# Patient Record
Sex: Male | Born: 1970 | Race: White | Hispanic: No | Marital: Single | Smoking: Current every day smoker
Health system: Southern US, Community
[De-identification: ages and names within clinical notes are randomized; demographics above are authoritative.]

## PROBLEM LIST (undated history)

## (undated) DIAGNOSIS — F32A Depression, unspecified: Secondary | ICD-10-CM

## (undated) DIAGNOSIS — F329 Major depressive disorder, single episode, unspecified: Secondary | ICD-10-CM

## (undated) DIAGNOSIS — F419 Anxiety disorder, unspecified: Secondary | ICD-10-CM

---

## 2006-01-25 ENCOUNTER — Ambulatory Visit (HOSPITAL_COMMUNITY): Admission: RE | Admit: 2006-01-25 | Discharge: 2006-01-25 | Payer: Self-pay | Admitting: Family Medicine

## 2006-01-28 ENCOUNTER — Ambulatory Visit (HOSPITAL_COMMUNITY): Admission: RE | Admit: 2006-01-28 | Discharge: 2006-01-28 | Payer: Self-pay | Admitting: Family Medicine

## 2006-02-11 ENCOUNTER — Ambulatory Visit: Payer: Self-pay | Admitting: Gastroenterology

## 2006-04-26 ENCOUNTER — Ambulatory Visit: Payer: Self-pay | Admitting: Gastroenterology

## 2006-05-03 ENCOUNTER — Ambulatory Visit: Payer: Self-pay | Admitting: Gastroenterology

## 2006-05-03 ENCOUNTER — Ambulatory Visit (HOSPITAL_COMMUNITY): Admission: RE | Admit: 2006-05-03 | Discharge: 2006-05-03 | Payer: Self-pay | Admitting: Gastroenterology

## 2006-05-03 ENCOUNTER — Encounter (INDEPENDENT_AMBULATORY_CARE_PROVIDER_SITE_OTHER): Payer: Self-pay | Admitting: *Deleted

## 2006-05-03 HISTORY — PX: ESOPHAGOGASTRODUODENOSCOPY: SHX1529

## 2008-08-07 HISTORY — PX: SKIN BIOPSY: SHX1

## 2008-11-12 ENCOUNTER — Encounter (INDEPENDENT_AMBULATORY_CARE_PROVIDER_SITE_OTHER): Payer: Self-pay | Admitting: General Surgery

## 2008-11-12 ENCOUNTER — Ambulatory Visit (HOSPITAL_COMMUNITY): Admission: RE | Admit: 2008-11-12 | Discharge: 2008-11-12 | Payer: Self-pay | Admitting: General Surgery

## 2010-03-16 ENCOUNTER — Ambulatory Visit: Payer: Self-pay | Admitting: Otolaryngology

## 2010-05-18 ENCOUNTER — Ambulatory Visit (INDEPENDENT_AMBULATORY_CARE_PROVIDER_SITE_OTHER): Payer: BC Managed Care – PPO | Admitting: Otolaryngology

## 2010-05-18 DIAGNOSIS — D487 Neoplasm of uncertain behavior of other specified sites: Secondary | ICD-10-CM

## 2010-05-18 DIAGNOSIS — R220 Localized swelling, mass and lump, head: Secondary | ICD-10-CM

## 2010-05-18 DIAGNOSIS — R221 Localized swelling, mass and lump, neck: Secondary | ICD-10-CM

## 2010-07-15 LAB — BASIC METABOLIC PANEL
CO2: 24 mEq/L (ref 19–32)
Calcium: 9.3 mg/dL (ref 8.4–10.5)
Creatinine, Ser: 0.87 mg/dL (ref 0.4–1.5)
GFR calc Af Amer: >60 mL/min (ref >60)
GFR calc non Af Amer: >60 mL/min (ref >60)
Glucose, Bld: 96 mg/dL (ref 70–99)
Sodium: 138 mEq/L (ref 135–145)

## 2010-07-15 LAB — CBC
Hemoglobin: 16.1 g/dL (ref 13.0–17.0)
MCHC: 35.2 g/dL (ref 30.0–36.0)
RDW: 12.6 % (ref 11.5–15.5)

## 2010-08-22 NOTE — H&P (Signed)
NAMEKENLY, XIAO                ACCOUNT NO.:  [id number]   MEDICAL RECORD NO.:  [id number]          PATIENT TYPE:  AMB   LOCATION:  DAY                           FACILITY:  [facility]   PHYSICIAN:  Tilford Pillar, MD      DATE OF BIRTH:  Sep 18, 1970   DATE OF ADMISSION:  DATE OF DISCHARGE:  LH                              HISTORY & PHYSICAL   CHIEF COMPLAINT:  Bump on face.   HISTORY OF PRESENT ILLNESS:  The patient is a 40 year old male, who  presented to my office on referral for approximately 6-7 month history  of a small nodule on his left buccal aspect of his face.  This is not  that significantly changed in size, although he does state it is  slightly larger than when he first noticed it.  He has not checked has  had any associated pain.  He has had no overlying erythema.  No  discharge.  He has had no fever or chills.  He has had no significant  weight changes.  The patient denies any trauma.  No difficulties with  dysphasia.   PAST MEDICAL HISTORY:  None.   PAST SURGICAL HISTORY:  None.   MEDICATIONS:  None.   ALLERGIES:  No known drug allergies.   SOCIAL HISTORY:  He is a 1-pack per day smoker.  He does occasionally  drink on weekends.  Occupation is as a [employer] architect.   FAMILY HISTORY:  There is a family history of cancer, although the  patient is unsure of exact type of cancer.   REVIEW OF SYSTEMS:  CONSTITUTIONAL:  Unremarkable.  EYES:  Unremarkable.  EARS, NOSE AND THROAT:  As per HPI, otherwise unremarkable.  RESPIRATORY:  Unremarkable.  CARDIOVASCULAR:  Unremarkable.  GASTROINTESTINAL:  Unremarkable.  GENITOURINARY:  Unremarkable.  MUSCULOSKELETAL, SKIN, ENDOCRINE, AND NEURO:  Are all unremarkable.   PHYSICAL EXAMINATION:  GENERAL:  The patient is a healthy, calm-  appearing male, in no acute distress.  He is alert and oriented x3.  HEENT:  Scalp, no deformities, no masses.  Eyes, pupils are equal,  extraocular movements are intact.  No conjunctival pallor is  noted.  Oral mucosa is pink.  Normal occlusion.  No tooth abnormality is noted.  The patient does have a mobile, nontender, very superficial nodule on  the left buccal region and does not appear to be attached in any  underlying structures.  There is no sensory changes with tapping or  palpations.  On intraoral bimanual palpation manipulation, they did not  appreciate any oral mucosal involvement, does not feel to be attached  muscle.  There is no sinus noted either intraoral or changes.  NECK:  Trachea is midline.  No cervical lymphadenopathy.  PULMONARY:  Unlabored respirations.  He is clear to auscultation.  No  crackles are appreciated.  CARDIOVASCULAR:  Regular rate and rhythm.  He has 2+ radial and femoral  pulses bilaterally.  ABDOMEN:  Positive bowel sounds.  Abdomen is soft and nontender.  EXTREMITIES:  Were warm and dry.   PLAN:  Facial cyst.  At this time, I  did discuss with the patient the  findings.  I have a low suspicion of any involvement of the parotid  gland or any salivary gland etiology based on its appearance.  I have a  low suspicion at the present, it does appear to be a benign lesion, it  is extremely superficial.  I did discuss with the patient the  possibility of excision.  I did discuss possibility of salivary gland  involvement or node involvement, however, I have an extremely low  suspicion that they are at risk.  He understands the risks, benefits,  and alternatives of excision, and does wish to proceed at his earliest  convenience.      Tilford Pillar, MD  Electronically Signed     BZ/MEDQ  D:  11/11/2008  T:  11/12/2008  Job:  [id number]   cc:   Angus G. Renard Matter, MD  Fax: [phone]

## 2010-08-22 NOTE — Op Note (Signed)
Randy Wu, Randy Wu                ACCOUNT NO.:  [id number]   MEDICAL RECORD NO.:  [id number]          PATIENT TYPE:  AMB   LOCATION:  DAY                           FACILITY:  [facility]   PHYSICIAN:  Tilford Pillar, MD      DATE OF BIRTH:  11/22/1970   DATE OF PROCEDURE:  11/12/2008  DATE OF DISCHARGE:  11/12/2008                               OPERATIVE REPORT   PREOPERATIVE DIAGNOSIS:  Soft tissue mass on the left buccal space.   POSTOPERATIVE DIAGNOSIS:  Soft tissue mass on the left buccal space.   PROCEDURE:  Excision of soft tissue mass via 1.5-cm incision on the left  buccal space.   SURGEON:  Tilford Pillar, MD   ANESTHESIA:  MAC sedation with local anesthetic, utilized 1% lidocaine  with epinephrine.   SPECIMEN:  Soft tissue mass.   INTRAOPERATIVE FINDINGS:  Suspicious for a sebaceous cyst.   ESTIMATED BLOOD LOSS:  Scant.   INDICATIONS:  The patient is a 40 year old male who presented to my  office with a history of a palpable nodule along his left cheek.  This  has been present for several months, slowly increased in size.  Due to  its slight increase in size, the patient did wish to proceed with  excision.  The risks, benefits, and alternatives of excision of soft  tissue mass was discussed at length with the patient including but not  limited to risk of bleeding, infection, and recurrence.  At the time of  preoperative evaluation, it did not appear to be involving the parotid  or the underlying musculature or neurovascular structures.  The  patient's questions and concerns were addressed.  The patient was  consented for planned procedure.   OPERATION:  The patient was taken to the operating room and placed in  the supine position on the operating room table at which time the MAC  anesthetic was administered.  Once the patient was sedated, his left  face was prepped with Betadine solution.  Sterile drapes were placed  using Ioban drape to protect the area.  At this time,  a marking pen was  utilized to mark an elliptical incision orienting this to fall within  the skin folds.  Local anesthetic was instilled.  Skin incision was made  initially with a scalpel.  Additional dissection was carried out using  the needle-tipped electrocautery.  The soft tissue mass was  circumferentially excised and upon removal of this and placed in the  back table.  During the dissection, a small defect was created, which  did express some sebaceous-appearing material.  The wound was copiously  irrigated.  The hemostasis was obtained using electrocautery.  I was  quite pleased with the appearance of the wound.  At this time, I  reapproximated the skin edges with a 4-0 Monocryl in a running  subcuticular suture.  The skin was washed and dried with moist and dry  towel.  Benzoin was applied around the incision.  Half-inch Steri-Strips  were placed.  The drapes were removed.  The patient was allowed to come  out of  sedation  and was transferred back to regular hospital bed.  He was  transferred to Postanesthetic Care Unit in stable condition.  At the  conclusion of procedure, all instrument, sponge, and needle counts were  correct.  The patient tolerated the procedure extremely well.      Tilford Pillar, MD  Electronically Signed     BZ/MEDQ  D:  11/12/2008  T:  11/13/2008  Job:  [id number]   cc:   Angus G. Renard Matter, MD  Fax: [phone]

## 2010-08-25 NOTE — Op Note (Signed)
NAMEKENNEN, STAMMER                ACCOUNT NO.:  [id number]   MEDICAL RECORD NO.:  [id number]          PATIENT TYPE:  AMB   LOCATION:  DAY                           FACILITY:  [facility]   PHYSICIAN:  Kassie Mends, M.D.      DATE OF BIRTH:  [birth year]   DATE OF PROCEDURE:  05/03/2006  DATE OF DISCHARGE:                               OPERATIVE REPORT   REFERRING PHYSICIAN:  Catalina Pizza, M.D.   PROCEDURE:  Esophagogastroduodenoscopy with cold forceps biopsy.   INDICATION FOR EXAMINATION:  Mr. Yost is a 40 year old male with  persistent left upper quadrant pain.  He was first seen in November 2007  with left upper quadrant pain which is felt to be musculoskeletal in  etiology.  He presented again January 2008 with persistent left upper  quadrant pain which was exacerbated by food.  He also had a 7-pound  weight loss.  His pain was improved with AcipHex.   FINDINGS:  Mild erythema of the body and antrum.  Biopsies obtained to  evaluate for eosinophilic gastritis or H. pylori infection.  Otherwise  normal esophagus without erythema, erosion or ulceration.  No evidence  of Barrett's.  Normal duodenal bulb and second portion of the duodenum.   RECOMMENDATIONS:  1. Continue AcipHex.  2. I will call Mr. Goldring was results of his biopsies.  The      differential diagnosis includes musculoskeletal abdominal pain, H.      pylori gastritis, eosinophilic gastritis, non-ulcer dyspepsia, and      gastroesophageal reflux disease.  3. He has a follow-up appointment with me in 4 weeks.   MEDICATIONS:  1. Demerol 125 mg IV.  2. Versed 7 mg IV.   PROCEDURE TECHNIQUE:  Physical exam was performed and informed consent  was obtained from the patient after explaining the benefits, risks and  alternatives to the procedure.  The patient was connected to the monitor  and placed in the left lateral position.  Continuous oxygen was provided  by nasal cannula and IV medicine administered through an indwelling  cannula.  After administration of sedation, the patient's esophagus was  intubated with a diagnostic gastroscope and the scope was advanced under  direct visualization to the second portion of the  duodenum.  The scope was subsequently removed slowly by carefully  examining the color, texture, anatomy and integrity of the mucosa on the  way out.  The patient was recovered endoscopy suite and discharged home  in satisfactory condition.      Kassie Mends, M.D.  Electronically Signed     SM/MEDQ  D:  05/03/2006  T:  05/03/2006  Job:  [id number]   cc:   Catalina Pizza, M.D.  Fax: [phone]

## 2011-10-05 ENCOUNTER — Emergency Department (HOSPITAL_COMMUNITY): Payer: BC Managed Care – PPO

## 2011-10-05 ENCOUNTER — Encounter (HOSPITAL_COMMUNITY): Payer: Self-pay | Admitting: *Deleted

## 2011-10-05 ENCOUNTER — Emergency Department (HOSPITAL_COMMUNITY)
Admission: EM | Admit: 2011-10-05 | Discharge: 2011-10-06 | Disposition: A | Discharge status: Home / Self Care | Payer: BC Managed Care – PPO | Attending: Emergency Medicine | Admitting: Emergency Medicine

## 2011-10-05 DIAGNOSIS — I2510 Atherosclerotic heart disease of native coronary artery without angina pectoris: Secondary | ICD-10-CM | POA: Insufficient documentation

## 2011-10-05 DIAGNOSIS — I700 Atherosclerosis of aorta: Secondary | ICD-10-CM | POA: Insufficient documentation

## 2011-10-05 DIAGNOSIS — M549 Dorsalgia, unspecified: Secondary | ICD-10-CM | POA: Insufficient documentation

## 2011-10-05 DIAGNOSIS — R197 Diarrhea, unspecified: Secondary | ICD-10-CM | POA: Insufficient documentation

## 2011-10-05 DIAGNOSIS — R110 Nausea: Secondary | ICD-10-CM | POA: Insufficient documentation

## 2011-10-05 DIAGNOSIS — R079 Chest pain, unspecified: Secondary | ICD-10-CM | POA: Insufficient documentation

## 2011-10-05 DIAGNOSIS — R6883 Chills (without fever): Secondary | ICD-10-CM | POA: Insufficient documentation

## 2011-10-05 LAB — COMPREHENSIVE METABOLIC PANEL
ALT: 30 U/L (ref 0–53)
Alkaline Phosphatase: 57 U/L (ref 39–117)
BUN: 15 mg/dL (ref 6–23)
CO2: 24 mEq/L (ref 19–32)
Chloride: 99 mEq/L (ref 96–112)
GFR calc Af Amer: 85 mL/min — ABNORMAL LOW (ref >90)
Glucose, Bld: 85 mg/dL (ref 70–99)
Potassium: 3.4 mEq/L — ABNORMAL LOW (ref 3.5–5.1)
Sodium: 135 mEq/L (ref 135–145)
Total Bilirubin: 0.4 mg/dL (ref 0.3–1.2)
Total Protein: 7.4 g/dL (ref 6.0–8.3)

## 2011-10-05 LAB — CBC WITH DIFFERENTIAL/PLATELET
Hemoglobin: 16.3 g/dL (ref 13.0–17.0)
Lymphocytes Relative: 37 % (ref 12–46)
Lymphs Abs: 1.9 10*3/uL (ref 0.7–4.0)
Monocytes Relative: 11 % (ref 3–12)
Neutro Abs: 2.4 10*3/uL (ref 1.7–7.7)
Neutrophils Relative %: 47 % (ref 43–77)
Platelets: 172 10*3/uL (ref 150–400)
RBC: 4.85 MIL/uL (ref 4.22–5.81)
WBC: 5 10*3/uL (ref 4.0–10.5)

## 2011-10-05 MED ORDER — GI COCKTAIL ~~LOC~~
30.0000 mL | Freq: Once | ORAL | Status: AC
  Administered 2011-10-05: 30 mL via ORAL
  Filled 2011-10-05: qty 30

## 2011-10-05 MED ORDER — MORPHINE SULFATE 2 MG/ML IJ SOLN
2.0000 mg | Freq: Once | INTRAMUSCULAR | Status: AC
  Administered 2011-10-05: 2 mg via INTRAVENOUS
  Filled 2011-10-05: qty 1

## 2011-10-05 MED ORDER — IOHEXOL 350 MG/ML SOLN
100.0000 mL | Freq: Once | INTRAVENOUS | Status: AC | PRN
  Administered 2011-10-05: 100 mL via INTRAVENOUS

## 2011-10-05 MED ORDER — SODIUM CHLORIDE 0.9 % IV SOLN
Freq: Once | INTRAVENOUS | Status: AC
  Administered 2011-10-05: 21:00:00 via INTRAVENOUS

## 2011-10-05 NOTE — ED Notes (Signed)
Chest pain, nausea, no vomiting, "my stomach is all upset".  Lt chest pain  Like an electrical shock.pain in t spine region for 2 weeks.

## 2011-10-05 NOTE — ED Provider Notes (Cosign Needed)
History   This chart was scribed for Benny Lennert, MD by Charolett Bumpers . The patient was seen in room [facility].    CSN: [id number]  Arrival date & time 10/05/11  2025   First MD Initiated Contact with Patient 10/05/11 2032      Chief Complaint  Patient presents with  . Chest Pain    (Consider location/radiation/quality/duration/timing/severity/associated sxs/prior treatment) HPI Comments: Randy Wu is a 41 y.o. male who presents to the Emergency Department complaining of intermittent, mild chest pain that started today. Patient states that his chest pain is a pinching sensation or an electrical shock. Patient states that the chest pain lasted for 5 seconds and occurred X2 in 30 minutes. Patient states that he had diaphoresis and chills after the chest pain. Patient reports that he started having nausea, diarrhea that started 2 days ago. Patient reports that he has also had mid back pain for the past 2 weeks. Patient reports recent increased stress. Patient states that he took 3 baby aspirin about an hour ago with moderate relief. Patient states that he is a smoker. Patient denies taking any medications regularly. Patient reports a family h/o heart problems.   PCP: Dr. Renard Matter  Patient is a 41 y.o. male presenting with chest pain. The history is provided by the patient.  Chest Pain The chest pain began 1 - 2 hours ago. Chest pain occurs intermittently. The chest pain is unchanged. The severity of the pain is mild. The quality of the pain is described as brief. The pain does not radiate. Primary symptoms include nausea. Pertinent negatives for primary symptoms include no vomiting.  Associated symptoms include diaphoresis. He tried aspirin for the symptoms. Risk factors include male gender, stress and smoking/tobacco exposure.  His family medical history is significant for family history of aortic dissection and early MI in family.     History reviewed. No pertinent  past medical history.  History reviewed. No pertinent past surgical history.  History reviewed. No pertinent family history.  History  Substance Use Topics  . Smoking status: Current Everyday Smoker  . Smokeless tobacco: Not on file  . Alcohol Use: Yes      Review of Systems  Constitutional: Positive for chills, diaphoresis and appetite change.  Cardiovascular: Positive for chest pain.  Gastrointestinal: Positive for nausea and diarrhea. Negative for vomiting.  Musculoskeletal: Positive for back pain.  All other systems reviewed and are negative.    Allergies  Review of patient's allergies indicates no known allergies.  Home Medications   Current Outpatient Rx  Name Route Sig Dispense Refill  . ASPIRIN EC 81 MG PO TBEC Oral Take 81-162 mg by mouth once as needed. For chest pain      BP 136/95  Pulse 81  Temp 98.2 F (36.8 C) (Oral)  Resp 20  Ht 5\' 8"  (1.727 m)  Wt 185 lb (83.915 kg)  BMI 28.13 kg/m2  SpO2 99%  Physical Exam  Constitutional: He is oriented to person, place, and time. He appears well-developed.  HENT:  Head: Normocephalic and atraumatic.  Eyes: Conjunctivae and EOM are normal. No scleral icterus.  Neck: Neck supple. No thyromegaly present.  Cardiovascular: Normal rate and regular rhythm.  Exam reveals no gallop and no friction rub.   No murmur heard. Pulmonary/Chest: No stridor. He has no wheezes. He has no rales. He exhibits tenderness.       Left lateral chest wall tenderness.   Abdominal: He exhibits no distension. There is no  tenderness. There is no rebound.  Musculoskeletal: Normal range of motion. He exhibits no edema.  Lymphadenopathy:    He has no cervical adenopathy.  Neurological: He is oriented to person, place, and time. Coordination normal.  Skin: No rash noted. No erythema.  Psychiatric: He has a normal mood and affect. His behavior is normal.    ED Course  Procedures (including critical care time)  DIAGNOSTIC  STUDIES: Oxygen Saturation is 100% on room air, normal by my interpretation.    COORDINATION OF CARE:  2048: Discussed planned course of treatment with the patient, who is agreeable at this time.  2100: Medication Orders: 0.9% sodium chloride infusion-once; Morphine 2 mg/mL injection 2 mg-once 2220: Recheck: Patient still complains of pain. Patient states that he has had severe heartburn for the past 2 days.  2245: Medication Orders: GI cocktail (Maalox, Lidocaine, Donnatal)-once.    Labs Reviewed  COMPREHENSIVE METABOLIC PANEL - Abnormal; Notable for the following:    Potassium 3.4 (*)     GFR calc non Af Amer 74 (*)     GFR calc Af Amer 85 (*)     All other components within normal limits  CBC WITH DIFFERENTIAL  TROPONIN I   Dg Chest 2 View  10/05/2011  *RADIOLOGY REPORT*  Clinical Data: Chest and right upper back pain, indigestion, history smoking, hiatal hernia  CHEST - 2 VIEW  Comparison: 01/25/2006  Findings: Upper-normal size of cardiac silhouette. Mediastinal contours and pulmonary vascularity normal. Bronchitic changes. No acute infiltrate, pleural effusion or pneumothorax. No acute osseous findings.  IMPRESSION: Mild chronic bronchitic changes.  Original Report Authenticated By: Lollie Marrow, M.D.   Ct Angio Chest W/cm &/or Wo Cm  10/05/2011  *RADIOLOGY REPORT*  Clinical Data: Severe left chest pain.  CT ANGIOGRAPHY CHEST,CT ANGIOGRAPHY ABDOMEN AND PELVIS WITH CONTRAST AND WITHOUT CONTRAST  Technique:  Multidetector CT imaging of the chest using the standard protocol during bolus administration of intravenous contrast. Multiplanar reconstructed images including MIPs were obtained and reviewed to evaluate the vascular anatomy.,  Contrast: OMNIPAQUE IOHEXOL 350 MG/ML SOLN  Comparison: 01/28/2006 abdominal CT  Findings: Scattered atherosclerosis of the aorta and branch vessels.  Coronary artery calcification.  Separate origin versus early branching of the left vertebral artery  off the left subclavian.  Normal caliber aorta and branch vessels otherwise. Patent celiac axis, SMA, and single renal arteries bilaterally. Patent IMA.  Atherosclerotic disease of the aortic bifurcation and proximal common iliac arteries.  Vessels remain patent.  Patent iliac vasculature and proximal femoral arteries.  No dissection. No aneurysmal dilatation.  Normal heart size.  No pericardial effusion.  No pleural effusion. No intrathoracic lymphadenopathy.  Central airways are patent.  Minimal dependent atelectasis.  Lungs are otherwise predominately clear.  No pneumothorax.  Organ abnormality/lesion detection is limited in the absence of intravenous contrast as well as within the arterial phase. Within this limitation, unremarkable biliary system, spleen, pancreas, adrenal glands, kidneys.  No hydronephrosis or hydroureter.  Tiny hypodensity within the liver is nonspecific, favored to reflect a biliary cyst or hamartoma statistically.  No bowel obstruction.  No CT evidence for colitis.  No free intraperitoneal air or fluid.  Normal appendix.  No lymphadenopathy.  Thin-walled bladder.  A small fat containing inguinal hernias.  Multilevel degenerative change.  No acute osseous finding.  IMPRESSION: No aortic aneurysm or dissection.  Mild scattered atherosclerotic calcification of the aorta and branch vessels.  Coronary artery calcification.  No acute process identified by CT within the chest abdomen  pelvis.  Original Report Authenticated By: Waneta Martins, M.D.   Ct Angio Abd/pel W/ And/or W/o  10/05/2011  *RADIOLOGY REPORT*  Clinical Data: Severe left chest pain.  CT ANGIOGRAPHY CHEST,CT ANGIOGRAPHY ABDOMEN AND PELVIS WITH CONTRAST AND WITHOUT CONTRAST  Technique:  Multidetector CT imaging of the chest using the standard protocol during bolus administration of intravenous contrast. Multiplanar reconstructed images including MIPs were obtained and reviewed to evaluate the vascular anatomy.,  Contrast:  OMNIPAQUE IOHEXOL 350 MG/ML SOLN  Comparison: 01/28/2006 abdominal CT  Findings: Scattered atherosclerosis of the aorta and branch vessels.  Coronary artery calcification.  Separate origin versus early branching of the left vertebral artery off the left subclavian.  Normal caliber aorta and branch vessels otherwise. Patent celiac axis, SMA, and single renal arteries bilaterally. Patent IMA.  Atherosclerotic disease of the aortic bifurcation and proximal common iliac arteries.  Vessels remain patent.  Patent iliac vasculature and proximal femoral arteries.  No dissection. No aneurysmal dilatation.  Normal heart size.  No pericardial effusion.  No pleural effusion. No intrathoracic lymphadenopathy.  Central airways are patent.  Minimal dependent atelectasis.  Lungs are otherwise predominately clear.  No pneumothorax.  Organ abnormality/lesion detection is limited in the absence of intravenous contrast as well as within the arterial phase. Within this limitation, unremarkable biliary system, spleen, pancreas, adrenal glands, kidneys.  No hydronephrosis or hydroureter.  Tiny hypodensity within the liver is nonspecific, favored to reflect a biliary cyst or hamartoma statistically.  No bowel obstruction.  No CT evidence for colitis.  No free intraperitoneal air or fluid.  Normal appendix.  No lymphadenopathy.  Thin-walled bladder.  A small fat containing inguinal hernias.  Multilevel degenerative change.  No acute osseous finding.  IMPRESSION: No aortic aneurysm or dissection.  Mild scattered atherosclerotic calcification of the aorta and branch vessels.  Coronary artery calcification.  No acute process identified by CT within the chest abdomen pelvis.  Original Report Authenticated By: Waneta Martins, M.D.     1. Chest pain    Pt had improvement of symptoms with gi cocktail   Date: 10/05/2011  Rate:82  Rhythm: normal sinus rhythm  QRS Axis: normal  Intervals: normal  ST/T Wave abnormalities:  normal  Conduction Disutrbances:none  Narrative Interpretation:   Old EKG Reviewed: none available   MDM   The chart was scribed for me under my direct supervision.  I personally performed the history, physical, and medical decision making and all procedures in the evaluation of this patient.Benny Lennert, MD 10/05/11 2310  Benny Lennert, MD 10/05/11 [phone]

## 2011-10-06 NOTE — ED Provider Notes (Signed)
Repeat troponin negative Resting comfortably Repeat EKG unchanged He has no active CP I feel he is safe for d/c home and discussed need for PCP and cardiology followup BP 119/78  Pulse 79  Temp 98.2 F (36.8 C) (Oral)  Resp 22  Ht 5\' 8"  (1.727 m)  Wt 185 lb (83.915 kg)  BMI 28.13 kg/m2  SpO2 96%   Joya Gaskins, MD 10/06/11 [phone]

## 2011-10-06 NOTE — Discharge Instructions (Signed)

## 2011-10-06 NOTE — ED Notes (Signed)
Pt stable at discharge instructed to call [phone] if CP reoccurrs

## 2011-10-29 ENCOUNTER — Encounter: Payer: Self-pay | Admitting: Gastroenterology

## 2011-10-30 ENCOUNTER — Ambulatory Visit: Payer: BC Managed Care – PPO | Admitting: Gastroenterology

## 2013-04-13 ENCOUNTER — Encounter (HOSPITAL_COMMUNITY): Payer: Self-pay | Admitting: *Deleted

## 2013-04-13 ENCOUNTER — Encounter (HOSPITAL_COMMUNITY): Payer: Self-pay | Admitting: Emergency Medicine

## 2013-04-13 ENCOUNTER — Inpatient Hospital Stay (HOSPITAL_COMMUNITY)
Admission: AD | Admit: 2013-04-13 | Discharge: 2013-04-16 | DRG: 885 | Disposition: A | Discharge status: Home / Self Care | Payer: 59 | Source: Intra-hospital | Attending: Psychiatry | Admitting: Psychiatry

## 2013-04-13 ENCOUNTER — Emergency Department (HOSPITAL_COMMUNITY)
Admission: EM | Admit: 2013-04-13 | Discharge: 2013-04-13 | Disposition: A | Discharge status: Other Acute Inpatient Hospital | Payer: BC Managed Care – PPO | Attending: Emergency Medicine | Admitting: Emergency Medicine

## 2013-04-13 DIAGNOSIS — F4310 Post-traumatic stress disorder, unspecified: Secondary | ICD-10-CM | POA: Diagnosis present

## 2013-04-13 DIAGNOSIS — F39 Unspecified mood [affective] disorder: Secondary | ICD-10-CM | POA: Insufficient documentation

## 2013-04-13 DIAGNOSIS — F329 Major depressive disorder, single episode, unspecified: Secondary | ICD-10-CM | POA: Diagnosis present

## 2013-04-13 DIAGNOSIS — F332 Major depressive disorder, recurrent severe without psychotic features: Principal | ICD-10-CM | POA: Diagnosis present

## 2013-04-13 DIAGNOSIS — R45851 Suicidal ideations: Secondary | ICD-10-CM | POA: Insufficient documentation

## 2013-04-13 DIAGNOSIS — F17200 Nicotine dependence, unspecified, uncomplicated: Secondary | ICD-10-CM | POA: Insufficient documentation

## 2013-04-13 DIAGNOSIS — R454 Irritability and anger: Secondary | ICD-10-CM | POA: Insufficient documentation

## 2013-04-13 DIAGNOSIS — F1910 Other psychoactive substance abuse, uncomplicated: Secondary | ICD-10-CM | POA: Diagnosis present

## 2013-04-13 DIAGNOSIS — F32A Depression, unspecified: Secondary | ICD-10-CM

## 2013-04-13 DIAGNOSIS — F3289 Other specified depressive episodes: Secondary | ICD-10-CM | POA: Insufficient documentation

## 2013-04-13 DIAGNOSIS — F1010 Alcohol abuse, uncomplicated: Secondary | ICD-10-CM | POA: Diagnosis present

## 2013-04-13 DIAGNOSIS — IMO0002 Reserved for concepts with insufficient information to code with codable children: Secondary | ICD-10-CM | POA: Insufficient documentation

## 2013-04-13 HISTORY — DX: Depression, unspecified: F32.A

## 2013-04-13 HISTORY — DX: Major depressive disorder, single episode, unspecified: F32.9

## 2013-04-13 HISTORY — DX: Anxiety disorder, unspecified: F41.9

## 2013-04-13 LAB — URINALYSIS, ROUTINE W REFLEX MICROSCOPIC
Glucose, UA: NEGATIVE mg/dL
Hgb urine dipstick: NEGATIVE
Ketones, ur: 15 mg/dL — AB
LEUKOCYTES UA: NEGATIVE
NITRITE: NEGATIVE
PH: 6.5 (ref 5.0–8.0)
Protein, ur: NEGATIVE mg/dL
SPECIFIC GRAVITY, URINE: 1.025 (ref 1.005–1.030)
UROBILINOGEN UA: 1 mg/dL (ref 0.0–1.0)

## 2013-04-13 LAB — BASIC METABOLIC PANEL
BUN: 12 mg/dL (ref 6–23)
CALCIUM: 9.2 mg/dL (ref 8.4–10.5)
CHLORIDE: 101 meq/L (ref 96–112)
CO2: 28 meq/L (ref 19–32)
Creatinine, Ser: 1.24 mg/dL (ref 0.50–1.35)
GFR calc non Af Amer: 70 mL/min — ABNORMAL LOW (ref >90)
GFR, EST AFRICAN AMERICAN: 81 mL/min — AB (ref >90)
Glucose, Bld: 91 mg/dL (ref 70–99)
Potassium: 4.5 mEq/L (ref 3.7–5.3)
SODIUM: 138 meq/L (ref 137–147)

## 2013-04-13 LAB — CBC WITH DIFFERENTIAL/PLATELET
BASOS ABS: 0 10*3/uL (ref 0.0–0.1)
Basophils Relative: 1 % (ref 0–1)
EOS PCT: 5 % (ref 0–5)
Eosinophils Absolute: 0.3 10*3/uL (ref 0.0–0.7)
HCT: 51.3 % (ref 39.0–52.0)
Hemoglobin: 17.4 g/dL — ABNORMAL HIGH (ref 13.0–17.0)
LYMPHS ABS: 1.2 10*3/uL (ref 0.7–4.0)
LYMPHS PCT: 21 % (ref 12–46)
MCH: 34.5 pg — ABNORMAL HIGH (ref 26.0–34.0)
MCHC: 33.9 g/dL (ref 30.0–36.0)
MCV: 101.6 fL — AB (ref 78.0–100.0)
Monocytes Absolute: 0.5 10*3/uL (ref 0.1–1.0)
Monocytes Relative: 8 % (ref 3–12)
NEUTROS ABS: 4 10*3/uL (ref 1.7–7.7)
Neutrophils Relative %: 66 % (ref 43–77)
PLATELETS: 198 10*3/uL (ref 150–400)
RBC: 5.05 MIL/uL (ref 4.22–5.81)
RDW: 13 % (ref 11.5–15.5)
WBC: 6 10*3/uL (ref 4.0–10.5)

## 2013-04-13 LAB — RAPID URINE DRUG SCREEN, HOSP PERFORMED
Amphetamines: NOT DETECTED
BENZODIAZEPINES: POSITIVE — AB
Barbiturates: NOT DETECTED
COCAINE: POSITIVE — AB
OPIATES: NOT DETECTED
Tetrahydrocannabinol: POSITIVE — AB

## 2013-04-13 LAB — ETHANOL

## 2013-04-13 MED ORDER — ACETAMINOPHEN 325 MG PO TABS: 650.0000 mg | ORAL_TABLET | Freq: Four times a day (QID) | ORAL | Status: DC | PRN

## 2013-04-13 MED ORDER — CHLORDIAZEPOXIDE HCL 25 MG PO CAPS: 25.0000 mg | ORAL_CAPSULE | Freq: Four times a day (QID) | ORAL | Status: DC | PRN

## 2013-04-13 MED ORDER — TRAZODONE HCL 50 MG PO TABS
50.0000 mg | ORAL_TABLET | Freq: Every evening | ORAL | Status: DC | PRN
  Filled 2013-04-13 (×7): qty 1

## 2013-04-13 MED ORDER — ALUM & MAG HYDROXIDE-SIMETH 200-200-20 MG/5ML PO SUSP: 30.0000 mL | ORAL | Status: DC | PRN

## 2013-04-13 MED ORDER — MAGNESIUM HYDROXIDE 400 MG/5ML PO SUSP: 30.0000 mL | Freq: Every day | ORAL | Status: DC | PRN
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
## 2013-04-13 NOTE — ED Notes (Signed)
Called for TTS Consult.  Per Stanton Kidney at [facility], "it may be awhile, he's 3rd on the list".

## 2013-04-13 NOTE — ED Provider Notes (Signed)
Voluntary transfer to [facility] accepted; Pt agrees.  Babette Relic, MD 04/15/13 [phone]
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
## 2013-04-13 NOTE — BH Assessment (Signed)
[facility] Assessment Progress Note      Called Dr Rogene Houston to obtain clinical information.  Pt is suicidal.

## 2013-04-13 NOTE — Tx Team (Signed)
Initial Interdisciplinary Treatment Plan  PATIENT STRENGTHS: (choose at least two) Ability for insight Active sense of humor Average or above average intelligence Capable of independent living Communication skills Financial means General fund of knowledge Motivation for treatment/growth Physical Health Special hobby/interest Supportive family/friends Work skills  PATIENT STRESSORS: Traumatic event   PROBLEM LIST: Problem List/Patient Goals Date to be addressed Date deferred Reason deferred Estimated date of resolution  "Decreased thoughts of past traumatic events" 13 Apr 2013     "Stop thinking about ways to commit suicide and be happy" 13 Apr 2013                                                DISCHARGE CRITERIA:  Ability to meet basic life and health needs Adequate post-discharge living arrangements Improved stabilization in mood, thinking, and/or behavior Motivation to continue treatment in a less acute level of care Need for constant or close observation no longer present Reduction of life-threatening or endangering symptoms to within safe limits Safe-care adequate arrangements made Verbal commitment to aftercare and medication compliance  PRELIMINARY DISCHARGE PLAN: Outpatient therapy Return to previous living arrangement Return to previous work or school arrangements  PATIENT/FAMIILY INVOLVEMENT: This treatment plan has been presented to and reviewed with the patient, Randy Wu, and/or family member.  The patient and family have been given the opportunity to ask questions and make suggestions.  [name] 04/13/2013, 9:40 PM
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
## 2013-04-13 NOTE — ED Notes (Signed)
Patient changed into blue scrubs per policy. Wanded by [employer]. Room stripped of cords/wires/equipment per policy.

## 2013-04-13 NOTE — ED Notes (Signed)
[facility] called for transport to [facility].  They will send a driver right over.

## 2013-04-13 NOTE — ED Notes (Addendum)
"  I'm feeling down, depressed,,".Brought by family member.  Took " handful " of  Valium  And etoh yesterday, then went to sleep.  .  Alert, talking, Wanded at triage

## 2013-04-14 DIAGNOSIS — F411 Generalized anxiety disorder: Secondary | ICD-10-CM

## 2013-04-14 DIAGNOSIS — F321 Major depressive disorder, single episode, moderate: Secondary | ICD-10-CM

## 2013-04-14 DIAGNOSIS — F4310 Post-traumatic stress disorder, unspecified: Secondary | ICD-10-CM

## 2013-04-14 DIAGNOSIS — F1910 Other psychoactive substance abuse, uncomplicated: Secondary | ICD-10-CM

## 2013-04-14 NOTE — BHH Group Notes (Signed)
Adult Psychoeducational Group Note  Date:  04/14/2013 Time:  0900  Group Topic/Focus:  Orientation:   The focus of this group is to educate the patient on the purpose and policies of crisis stabilization and provide a format to answer questions about their admission.  The group details unit policies and expectations of patients while admitted.  Participation Level:  Minimal  Participation Quality:  Resistant  Affect:  Irritable  Cognitive:  Alert  Insight: Lacking  Engagement in Group:  Lacking  Modes of Intervention:  Orientation  Additional Comments:  Pt was irritable during group, set goal for himself today to talk to the MD and find out why he is here.  Wynn Banker 04/14/2013, 2:43 PM

## 2013-04-14 NOTE — BHH Suicide Risk Assessment (Signed)
[place] INPATIENT:  Family/Significant Other Suicide Prevention Education  Suicide Prevention Education:  Education Completed; Yolanda Manges - sister [phone]),  (name of family member/significant other) has been identified by the patient as the family member/significant other with whom the patient will be residing, and identified as the person(s) who will aid the patient in the event of a mental health crisis (suicidal ideations/suicide attempt).  With written consent from the patient, the family member/significant other has been provided the following suicide prevention education, prior to the and/or following the discharge of the patient.  The suicide prevention education provided includes the following:  Suicide risk factors  Suicide prevention and interventions  National Suicide Hotline telephone number  [facility] assessment telephone number  [facility] Emergency Assistance [place] and/or Residential Mobile Crisis Unit telephone number  Request made of family/significant other to:  Remove weapons (e.g., guns, rifles, knives), all items previously/currently identified as safety concern.    Remove drugs/medications (over-the-counter, prescriptions, illicit drugs), all items previously/currently identified as a safety concern.  The family member/significant other verbalizes understanding of the suicide prevention education information provided.  The family member/significant other agrees to remove the items of safety concern listed above.  Ane Payment 04/14/2013, 11:25 AM
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
## 2013-04-14 NOTE — Progress Notes (Signed)
Recreation Therapy Notes  Animal-Assisted Activity/Therapy (AAA/T) Program Checklist/Progress Notes Patient Eligibility Criteria Checklist & Daily Group note for Rec Tx Intervention  Date: 01.06.2015 Time: 2:45pm Location: [address]    AAA/T Program Assumption of Risk Form signed by Patient/ or Parent Legal Guardian yes  Patient is free of allergies or sever asthma yes  Patient reports no fear of animals yes  Patient reports no history of cruelty to animals yes   Patient understands his/her participation is voluntary yes  Behavioral Response: Did not attend.   Laureen Ochs Makelle Marrone, LRT/CTRS  Lane Hacker 04/14/2013 4:56 PM

## 2013-04-14 NOTE — Progress Notes (Addendum)
Patient ID: Randy Wu, male   DOB: 05-17-1970, 43 y.o.   MRN: [id number] D: Pt. Lying in bed eyes closed, resting quietly. A: Probation officer received report from [employer], Therapist, sports. Writer assessed for s/s of distress. Staff will monitor for s/s of distress.  Pt. Respirations are even unlabored. R: Pt. Is safe on the unit, no distress noted.

## 2013-04-14 NOTE — Progress Notes (Signed)
Adult Psychoeducational Group Note  Date:  04/14/2013 Time:  2000  Group Topic/Focus:  Wrap-Up Group:   The focus of this group is to help patients review their daily goal of treatment and discuss progress on daily workbooks.  Participation Level:  Minimal  Participation Quality:  Appropriate  Affect:  Depressed and Flat  Cognitive:  Alert, Appropriate and Oriented  Insight: Appropriate and Good  Engagement in Group:  Engaged  Modes of Intervention:  Discussion, Education, Exploration and Support  Additional Comments:  Pt stated that in order for him to get better he has to "stop hanging out with the bad crowd" that he had been around before being admitted.   Jacob Moores Monique 04/14/2013, 9:58 PM
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
## 2013-04-14 NOTE — Progress Notes (Signed)
D:  Per pt self inventory pt reports sleeping fair, appetite good, energy level high, ability to pay attention good, rates depression at a 2 out of 10 and hopelessness at a 1 out of 10, denies SI/HI/AVH.     A:  Emotional support provided, Encouraged pt to continue with treatment plan and attend all group activities, q15 min checks maintained for safety.  R:  Pt was irritable this am, states that he does not understand why he is here and wants to speak with the MD, pt is going to groups and is otherwise cooperative and calm with staff and other patients on the unit.
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
## 2013-04-15 DIAGNOSIS — F1994 Other psychoactive substance use, unspecified with psychoactive substance-induced mood disorder: Secondary | ICD-10-CM

## 2013-04-15 MED ORDER — NICOTINE 14 MG/24HR TD PT24
14.0000 mg | MEDICATED_PATCH | Freq: Every day | TRANSDERMAL | Status: DC
  Administered 2013-04-15: 14 mg via TRANSDERMAL
  Filled 2013-04-15 (×4): qty 1

## 2013-04-15 NOTE — Progress Notes (Signed)
NUTRITION ASSESSMENT  Pt identified as at risk on the Malnutrition Screen Tool  INTERVENTION: 1. Educated patient on the importance of nutrition and encouraged intake of food and beverages. 2. Discussed weight goals. 3. Supplements: none at this time  NUTRITION DIAGNOSIS: Unintentional weight loss related to sub-optimal intake as evidenced by pt report.   Goal: Pt to meet >/= 90% of their estimated nutrition needs.  Monitor:  PO intake  Assessment:  Patient admitted with an anxiety disorder.  Reports that he was eating well prior to admit and currently.  Weight has decreased from UBW of 185 to 160 lbs int he last couple of months secondary to his "nerves", "but I needed it".    43 y.o. male  Height: Ht Readings from Last 1 Encounters:  04/13/13 5\' 8"  (1.727 m)    Weight: Wt Readings from Last 1 Encounters:  04/13/13 160 lb 1 oz (72.604 kg)    Weight Hx: Wt Readings from Last 10 Encounters:  04/13/13 160 lb 1 oz (72.604 kg)  04/13/13 170 lb (77.111 kg)  10/05/11 185 lb (83.915 kg)    BMI:  Body mass index is 24.34 kg/(m^2). Pt meets criteria for normal based on current BMI.  Estimated Nutritional Needs: Kcal: 25-30 kcal/kg Protein: > 1 gram protein/kg Fluid: 1 ml/kcal  Diet Order: General Pt is also offered choice of unit snacks mid-morning and mid-afternoon.  Pt is eating as desired.   Lab results and medications reviewed.   Antonieta Iba, RD, LDN Clinical Inpatient Dietitian Pager:  [phone] Weekend and after hours pager:  [phone]

## 2013-04-15 NOTE — Progress Notes (Signed)
Adult Psychoeducational Group Note  Date:  04/15/2013 Time:  3:09 PM  Group Topic/Focus:  Personal Choices and Values:   The focus of this group is to help patients assess and explore the importance of values in their lives, how their values affect their decisions, how they express their values and what opposes their expression.  Participation Level:  Active  Participation Quality:  Appropriate and Attentive  Affect:  Appropriate  Cognitive:  Alert and Appropriate  Insight: Good and Improving  Engagement in Group:  Improving and Off Topic  Modes of Intervention:  Discussion, Exploration and Support  Additional Comments:  Pt's were encouraged to identify ways they have been helped during this hospitalization. This pt shared he has been listening to others. He is trying to think about others instead of being so focused on himself.  Gunnar Bulla 04/15/2013, 3:09 PM

## 2013-04-15 NOTE — B[facility] Group Notes (Signed)
[place] LCSW Group Therapy  04/15/2013 3:06 PM  Type of Therapy:  Group Therapy  Participation Level:  Active  Participation Quality:  Attentive  Affect:  Appropriate  Cognitive:  Alert and Oriented  Insight:  Limited  Engagement in Therapy:  Improving  Modes of Intervention:  Confrontation, Discussion, Education, Exploration, Problem-solving, Rapport Building, Socialization and Support  Summary of Progress/Problems: Emotion Regulation: This group focused on both positive and negative emotion identification and allowed group members to process ways to identify feelings, regulate negative emotions, and find healthy ways to manage internal/external emotions. Group members were asked to reflect on a time when their reaction to an emotion led to a negative outcome and explored how alternative responses using emotion regulation would have benefited them. Group members were also asked to discuss a time when emotion regulation was utilized when a negative emotion was experienced. Eryc was attentive and engaged throughout today's therapy group. At this time, Angello demonstrates limited insight AEB his inability to describe an emotion that he struggles with and ways to deal with this problem in a healthier way. "I just walk away when I'm mad. I don't have problems with my emotions really." Raford was able to acknowledge that having a structured work schedule and staying busy helps him remain stable and regulate emotions. He actively participated in group discussion and stated that he plans to call his friend and brother in law if and when he needs help or feels that he cannot deal with a problematic situation/emotion by himself.    Smart, Diyan Dave LCSWA  04/15/2013, 3:06 PM
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
## 2013-04-15 NOTE — [facility] Group Notes (Signed)
[facility] LCSW Aftercare Discharge Planning Group Note   04/15/2013 8:45 AM  Participation Quality:  Alert, Appropriate and Oriented  Mood/Affect:  Calm  Depression Rating: 0    Anxiety Rating:  0  Thoughts of Suicide:  Pt denies SI/HI  Will you contract for safety?   Yes  Current AVH:  Pt denies  Plan for Discharge/Comments:  Pt attended discharge planning group and actively participated in group.  CSW provided pt with today's workbook.  Pt reports feeling well today.  Pt will return home in [place] and return back to work.  Pt will follow up at [facility] for medication management and therapy. No further needs voiced by pt at this time.    Transportation Means: Pt reports access to transportation - family will pick pt up  Supports: No supports mentioned at this time  Regan Lemming, [place] 04/15/2013 9:32 AM

## 2013-04-15 NOTE — Progress Notes (Signed)
Pt reports his sleep as fair even after taking medication.  His appetite is good, energy normal and ability to pay attention as good.  He rated his his depression a 1 hopelessness 1 and denied any anxiety on his self-inventory.  He denies any S/H ideation.  No med changes thus far or any prn meds given.

## 2013-04-15 NOTE — Progress Notes (Signed)
Adult Psychoeducational Group Note  Date:  04/15/2013 Time:  9:50 PM  Group Topic/Focus:  NA group  Participation Level:  Active  Participation Quality:  Appropriate  Affect:  Appropriate  Cognitive:  Alert  Insight: Appropriate  Engagement in Group:  Engaged  Modes of Intervention:  Discussion  Additional Comments:    Sharmon Revere 04/15/2013, 9:50 PM
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
## 2013-04-15 NOTE — Progress Notes (Signed)
Recreation Therapy Notes  Date: 01.07.2014 Time: 2:45pm Location: [address] Dayroom   Group Topic: Communication, Team Building, Problem Solving  Goal Area(s) Addresses:  Patient will effectively work with peer towards shared goal.  Patient will identify skill used to make activity successful.  Patient will identify how skills used during activity can be used to reach post d/c goals.   Behavioral Response: Did not attend.   Laureen Ochs Robbert Langlinais, LRT/CTRS  Nai Borromeo L 04/15/2013 5:10 PM

## 2013-04-15 NOTE — Tx Team (Signed)
Interdisciplinary Treatment Plan Update (Adult)  Date: 04/15/2013  Time Reviewed:  9:45 AM  Progress in Treatment: Attending groups: Yes Participating in groups:  Yes Taking medication as prescribed:  Yes Tolerating medication:  Yes Family/Significant othe contact made: Yes, with pt's sister Patient understands diagnosis:  Yes Discussing patient identified problems/goals with staff:  Yes Medical problems stabilized or resolved:  Yes Denies suicidal/homicidal ideation: Yes Issues/concerns per patient self-inventory:  Yes Other:  New problem(s) identified: N/A  Discharge Plan or Barriers: Pt will follow up at [facility] for medication management and therapy.    Reason for Continuation of Hospitalization: Anxiety Depression Medication Stabilization  Comments: N/A  Estimated length of stay: 1 day, d/c tomorrow  For review of initial/current patient goals, please see plan of care.  Attendees: Patient:     Family:     Physician:  Dr. Sabra Heck 04/15/2013 10:08 AM   Nursing:   Satira Sark, RN 04/15/2013 10:08 AM   Clinical Social Worker:  Regan Lemming, LCSW 04/15/2013 10:08 AM   Other: Lars Pinks, RN case manager 04/15/2013 10:08 AM   Other:  Maxie Better, LCSWA 04/15/2013 10:08 AM   Other:  Agustina Caroli, NP 04/15/2013 10:08 AM   Other:  Para March, RN   Other:    Other:    Other:    Other:    Other:    Other:     Scribe for Treatment Team:   Ane Payment, 04/15/2013 , 10:08 AM

## 2013-04-16 DIAGNOSIS — F332 Major depressive disorder, recurrent severe without psychotic features: Principal | ICD-10-CM

## 2013-04-16 DIAGNOSIS — F1010 Alcohol abuse, uncomplicated: Secondary | ICD-10-CM

## 2013-04-16 MED ORDER — TRAZODONE HCL 50 MG PO TABS: 50.0000 mg | ORAL_TABLET | Freq: Every evening | ORAL | Status: DC | PRN

## 2013-04-16 NOTE — Progress Notes (Signed)
Pt was discharged home today.  He denied any S/I H/I or A/V hallucinations.    He was given f/u appointment, rx, sample medications, and hotline info booklet.  He voiced understanding to all instructions provided.
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
## 2013-04-16 NOTE — Progress Notes (Signed)
Patient ID: Randy Wu, male   DOB: [birth year], 43 y.o.   MRN: [id number] Pt attended [facility] wellness group with RN at 9 am. The patient's goal today is to make other people happy. Pt was informed of unit rules.

## 2013-04-16 NOTE — Progress Notes (Signed)
Patient ID: Randy Wu, male   DOB: 26-Jul-1970, 43 y.o.   MRN: [id number] Pt attended [place] meeting.
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
## 2013-04-16 NOTE — Progress Notes (Signed)
D: Pt is a pleasant 43 yr old male that is reporting readiness in discharge. He is denying any concerns he wishes for writer to address at this time. Pt is negative for any suicidal ideation. Pt verbalizes that the holidays were hard for him, but reports that he is much better now. Writer denied any need for trazodone this evening.  A: Continued support and availability as needed was extended to this pt. Staff continue to monitor pt with q60min checks.  R: Pt receptive to treatment. Pt remains safe at this time.

## 2013-04-16 NOTE — Progress Notes (Signed)
[facility] Adult Case Management Discharge Plan :  Will you be returning to the same living situation after discharge: Yes,  returning home  At discharge, do you have transportation home?:Yes,  family will pick pt up Do you have the ability to pay for your medications:Yes,  access to meds  Release of information consent forms completed and in the chart;  Patient's signature needed at discharge.  Patient to Follow up at: Follow-up Information   Follow up with Tamela Gammon On 04/20/2013. (Appointment scheduled at 7:45 - 11:00 am for hospital discharge appointment.  They will than schedule you for medication management and therapy appointments.  Referral # [id number] [phone])    Contact information:   Physical Address: [address], [place], [place] [address]  Mailing Address: [address], [place], [place] [address] Phone: [phone] Fax: [phone]      Patient denies SI/HI:   Yes,  denies SI/HI    Safety Planning and Suicide Prevention discussed:  Yes,  discussed with pt and pt's sister. See suicide prevention education note.   Ane Payment 04/16/2013, 11:02 AM

## 2013-04-16 NOTE — Discharge Instructions (Signed)
Depression, Adult °Depression is feeling sad, low, down in the dumps, blue, gloomy, or empty. In general, there are two kinds of depression: °· Normal sadness or grief. This can happen after something upsetting. It often goes away on its own within 2 weeks. After losing a loved one (bereavement), normal sadness and grief may last longer than two weeks. It usually gets better with time. °· Clinical depression. This kind lasts longer than normal sadness or grief. It keeps you from doing the things you normally do in life. It is often hard to function at home, work, or at school. It may affect your relationships with others. Treatment is often needed. °GET HELP RIGHT AWAY IF: °· You have thoughts about hurting yourself or others. °· You lose touch with reality (psychotic symptoms). You may: °· See or hear things that are not real. °· Have untrue beliefs about your life or people around you. °· Your medicine is giving you problems. °MAKE SURE YOU: °· Understand these instructions. °· Will watch your condition. °· Will get help right away if you are not doing well or get worse. °Document Released: 04/28/2010 Document Revised: 12/19/2011 Document Reviewed: 07/26/2011 °[employer]® Patient Information ©2014 [employer]. ° °

## 2013-04-21 NOTE — Progress Notes (Signed)
Patient Discharge Instructions:  After Visit Summary (AVS):   Faxed to:  04/21/13 Discharge Summary Note:   Faxed to:  04/21/13 Psychiatric Admission Assessment Note:   Faxed to:  04/21/13 Suicide Risk Assessment - Discharge Assessment:   Faxed to:  04/21/13 Faxed/Sent to the Next Level Care provider:  04/21/13 Faxed to [employer] [facility] [place], 04/21/2013, 3:46 PM

## 2013-05-28 ENCOUNTER — Encounter (HOSPITAL_COMMUNITY): Payer: Self-pay | Admitting: Emergency Medicine

## 2013-05-28 ENCOUNTER — Emergency Department (HOSPITAL_COMMUNITY)
Admission: EM | Admit: 2013-05-28 | Discharge: 2013-05-28 | Disposition: A | Discharge status: Home / Self Care | Payer: Self-pay | Attending: Emergency Medicine | Admitting: Emergency Medicine

## 2013-05-28 ENCOUNTER — Emergency Department (HOSPITAL_COMMUNITY): Payer: Self-pay

## 2013-05-28 DIAGNOSIS — IMO0002 Reserved for concepts with insufficient information to code with codable children: Secondary | ICD-10-CM | POA: Insufficient documentation

## 2013-05-28 DIAGNOSIS — F329 Major depressive disorder, single episode, unspecified: Secondary | ICD-10-CM | POA: Insufficient documentation

## 2013-05-28 DIAGNOSIS — F411 Generalized anxiety disorder: Secondary | ICD-10-CM | POA: Insufficient documentation

## 2013-05-28 DIAGNOSIS — S86819A Strain of other muscle(s) and tendon(s) at lower leg level, unspecified leg, initial encounter: Secondary | ICD-10-CM

## 2013-05-28 DIAGNOSIS — F17200 Nicotine dependence, unspecified, uncomplicated: Secondary | ICD-10-CM | POA: Insufficient documentation

## 2013-05-28 DIAGNOSIS — S93409A Sprain of unspecified ligament of unspecified ankle, initial encounter: Secondary | ICD-10-CM | POA: Insufficient documentation

## 2013-05-28 DIAGNOSIS — F3289 Other specified depressive episodes: Secondary | ICD-10-CM | POA: Insufficient documentation

## 2013-05-28 DIAGNOSIS — Y929 Unspecified place or not applicable: Secondary | ICD-10-CM | POA: Insufficient documentation

## 2013-05-28 DIAGNOSIS — Y9389 Activity, other specified: Secondary | ICD-10-CM | POA: Insufficient documentation

## 2013-05-28 DIAGNOSIS — S86112A Strain of other muscle(s) and tendon(s) of posterior muscle group at lower leg level, left leg, initial encounter: Secondary | ICD-10-CM

## 2013-05-28 DIAGNOSIS — R000 Tachycardia, unspecified: Secondary | ICD-10-CM | POA: Insufficient documentation

## 2013-05-28 DIAGNOSIS — W108XXA Fall (on) (from) other stairs and steps, initial encounter: Secondary | ICD-10-CM | POA: Insufficient documentation

## 2013-05-28 DIAGNOSIS — S838X9A Sprain of other specified parts of unspecified knee, initial encounter: Secondary | ICD-10-CM | POA: Insufficient documentation

## 2013-05-28 MED ORDER — BACLOFEN 10 MG PO TABS
10.0000 mg | ORAL_TABLET | Freq: Three times a day (TID) | ORAL | Status: AC
Start: 2013-05-28 — End: 2013-06-27

## 2013-05-28 MED ORDER — ONDANSETRON HCL 4 MG PO TABS
4.0000 mg | ORAL_TABLET | Freq: Once | ORAL | Status: AC
  Administered 2013-05-28: 4 mg via ORAL
  Filled 2013-05-28: qty 1

## 2013-05-28 MED ORDER — ACETAMINOPHEN-CODEINE #3 300-30 MG PO TABS: 1.0000 | ORAL_TABLET | Freq: Four times a day (QID) | ORAL | Status: DC | PRN

## 2013-05-28 MED ORDER — KETOROLAC TROMETHAMINE 10 MG PO TABS
10.0000 mg | ORAL_TABLET | Freq: Once | ORAL | Status: AC
  Administered 2013-05-28: 10 mg via ORAL
  Filled 2013-05-28: qty 1

## 2013-05-28 MED ORDER — ACETAMINOPHEN-CODEINE #3 300-30 MG PO TABS
1.0000 | ORAL_TABLET | Freq: Once | ORAL | Status: AC
  Administered 2013-05-28: 1 via ORAL
  Filled 2013-05-28: qty 1

## 2013-05-28 MED ORDER — DICLOFENAC SODIUM 75 MG PO TBEC: 75.0000 mg | DELAYED_RELEASE_TABLET | Freq: Two times a day (BID) | ORAL | Status: DC

## 2013-05-28 MED ORDER — METHOCARBAMOL 500 MG PO TABS
1000.0000 mg | ORAL_TABLET | Freq: Once | ORAL | Status: AC
Start: 2013-05-28 — End: 2013-05-28
  Administered 2013-05-28: 1000 mg via ORAL
  Filled 2013-05-28: qty 2

## 2013-05-28 NOTE — ED Provider Notes (Signed)
Medical screening examination/treatment/procedure(s) were performed by non-physician practitioner and as supervising physician I was immediately available for consultation/collaboration.  EKG Interpretation   None        Jasper Riling. Alvino Chapel, MD 05/28/13 [phone]

## 2013-05-28 NOTE — ED Notes (Signed)
Pt c/o left ankle pain after falling on Sunday.

## 2013-05-28 NOTE — ED Provider Notes (Signed)
CSN: [id number]     Arrival date & time 05/28/13  1645 History   First MD Initiated Contact with Patient 05/28/13 1711     Chief Complaint  Patient presents with  . Ankle Pain     (Consider location/radiation/quality/duration/timing/severity/associated sxs/prior Treatment) Patient is a 43 y.o. male presenting with ankle pain. The history is provided by the patient.  Ankle Pain Location:  Leg and ankle Time since incident:  4 days Injury: yes   Mechanism of injury comment:  Stepped off  a stair the wrong way. Leg location:  L leg Ankle location:  L ankle Pain details:    Quality:  Aching and cramping   Severity:  Moderate   Onset quality:  Sudden   Duration:  4 days   Timing:  Intermittent   Progression:  Worsening Chronicity:  New Dislocation: no   Prior injury to area:  Yes Worsened by:  Bearing weight Ineffective treatments:  Elevation Associated symptoms: back pain, decreased ROM and stiffness   Associated symptoms: no neck pain   Risk factors: no frequent fractures     Past Medical History  Diagnosis Date  . Anxiety   . Depression    Past Surgical History  Procedure Laterality Date  . Esophagogastroduodenoscopy  05/03/2006    Mild erythema of the body and antrum/normal esophagus without erythema, erosion or ulceration.  No evidence of Barrett's.  Normal duodenal bulb and second portion of the duodenum.  . Skin biopsy Left 5/10    Removal of benign cyst on left cheek   History reviewed. No pertinent family history. History  Substance Use Topics  . Smoking status: Current Every Day Smoker -- 0.75 packs/day for 23 years  . Smokeless tobacco: Not on file  . Alcohol Use: 10.8 oz/week    18 Cans of beer per week    Review of Systems  Constitutional: Negative for activity change.       All ROS Neg except as noted in HPI  HENT: Negative for nosebleeds.   Eyes: Negative for photophobia and discharge.  Respiratory: Negative for cough, shortness of breath and  wheezing.   Cardiovascular: Negative for chest pain and palpitations.  Gastrointestinal: Negative for abdominal pain and blood in stool.  Genitourinary: Negative for dysuria, frequency and hematuria.  Musculoskeletal: Positive for arthralgias, back pain and stiffness. Negative for neck pain.  Skin: Negative.   Neurological: Negative for dizziness, seizures and speech difficulty.  Psychiatric/Behavioral: Negative for hallucinations and confusion. The patient is nervous/anxious.        Depression      Allergies  Review of patient's allergies indicates no known allergies.  Home Medications   Current Outpatient Rx  Name  Route  Sig  Dispense  Refill  . ibuprofen (ADVIL,MOTRIN) 200 MG tablet   Oral   Take 600 mg by mouth every 6 (six) hours as needed for moderate pain.          BP 107/94  Pulse 103  Temp(Src) 97.9 F (36.6 C)  Resp 18  Ht 5\' 8"  (1.727 m)  Wt 165 lb (74.844 kg)  BMI 25.09 kg/m2  SpO2 100% Physical Exam  Nursing note and vitals reviewed. Constitutional: He is oriented to person, place, and time. He appears well-developed and well-nourished.  Non-toxic appearance.  HENT:  Head: Normocephalic.  Right Ear: Tympanic membrane and external ear normal.  Left Ear: Tympanic membrane and external ear normal.  Eyes: EOM and lids are normal. Pupils are equal, round, and reactive to light.  Neck: Normal range of motion. Neck supple. Carotid bruit is not present.  Cardiovascular: Regular rhythm, normal heart sounds, intact distal pulses and normal pulses.  Tachycardia present.   Pulmonary/Chest: Breath sounds normal. No respiratory distress.  Abdominal: Soft. Bowel sounds are normal. There is no tenderness. There is no guarding.  Musculoskeletal: Normal range of motion.  And is good range of motion of the left hip the knee. There is pain to palpation of the mid calf. The Achilles tendon is intact. There is no hematoma in the area of the fibula. There's no deformity of the  tibia, and no bruising in the anterior lower leg. There is pain of the lateral malleolus. There is mild swelling in the lateral malleolus. The dorsalis pedis and posterior tibial pulses are 2+. There no lesions between the toes. There no puncture wounds of the plantar surface of the left foot. The capillary refill is less than 2 seconds.  Lymphadenopathy:       Head (right side): No submandibular adenopathy present.       Head (left side): No submandibular adenopathy present.    He has no cervical adenopathy.  Neurological: He is alert and oriented to person, place, and time. He has normal strength. No cranial nerve deficit or sensory deficit.  Skin: Skin is warm and dry.  Psychiatric: He has a normal mood and affect. His speech is normal.    ED Course  Procedures (including critical care time) Labs Review Labs Reviewed - No data to display Imaging Review Dg Tibia/fibula Left  05/28/2013   CLINICAL DATA:  Left ankle pain after fall.  EXAM: LEFT TIBIA AND FIBULA - 2 VIEW  COMPARISON:  None.  FINDINGS: There is no evidence of fracture or other focal bone lesions. Soft tissues are unremarkable.  IMPRESSION: Normal left tibia and fibula.   Electronically Signed   By: Sabino Dick M.D.   On: 05/28/2013 17:49    EKG Interpretation   None       MDM Patient stepped off of the stairs the wrong way and injured his calf and ankle on the left. The x-ray of the left tibia and fibula are negative for fracture or dislocation. X-ray of the left ankle are negative for fracture or dislocation.  Is no hematoma noted of the left lower extremity. There is minimal swelling at the left ankle area.    Final diagnoses:  None    *I have reviewed nursing notes, vital signs, and all appropriate lab and imaging results for this patient.** The plan at this time is for the patient to be fitted with a posterior splint and crutches. He is referred to Dr. Luna Glasgow for additional evaluation and management.  Prescription for Tylenol with codeine, baclofen, and diclofenac given to the patient.   Lenox Ahr, PA-C 05/28/13 [phone]

## 2013-05-28 NOTE — Discharge Instructions (Signed)
The x-ray of your ankle in your tibia/fibula bones are negative for fracture or dislocation. Your examination is consistent with a strain of your calf muscle, and a sprain of your ankle. Please use the splint until you're seen by the orthopedic specialist listed above, or the orthopedic specialist of your choice. Please keep the leg elevated above your waist. Please apply ice. Please use Tylenol with codeine every 6 hours for pain, please take with food. Baclofen and Tylenol with codeine may cause drowsiness, please use with caution.

## 2013-11-17 ENCOUNTER — Encounter (HOSPITAL_COMMUNITY): Payer: Self-pay | Admitting: Emergency Medicine

## 2013-11-17 ENCOUNTER — Emergency Department (HOSPITAL_COMMUNITY)
Admission: EM | Admit: 2013-11-17 | Discharge: 2013-11-17 | Disposition: A | Discharge status: Home / Self Care | Payer: BC Managed Care – PPO | Attending: Emergency Medicine | Admitting: Emergency Medicine

## 2013-11-17 ENCOUNTER — Emergency Department (HOSPITAL_COMMUNITY): Payer: BC Managed Care – PPO

## 2013-11-17 DIAGNOSIS — Z23 Encounter for immunization: Secondary | ICD-10-CM | POA: Insufficient documentation

## 2013-11-17 DIAGNOSIS — W108XXA Fall (on) (from) other stairs and steps, initial encounter: Secondary | ICD-10-CM | POA: Insufficient documentation

## 2013-11-17 DIAGNOSIS — Y9301 Activity, walking, marching and hiking: Secondary | ICD-10-CM | POA: Insufficient documentation

## 2013-11-17 DIAGNOSIS — S0993XA Unspecified injury of face, initial encounter: Secondary | ICD-10-CM | POA: Insufficient documentation

## 2013-11-17 DIAGNOSIS — S0121XA Laceration without foreign body of nose, initial encounter: Secondary | ICD-10-CM

## 2013-11-17 DIAGNOSIS — S51819A Laceration without foreign body of unspecified forearm, initial encounter: Secondary | ICD-10-CM | POA: Diagnosis present

## 2013-11-17 DIAGNOSIS — W19XXXA Unspecified fall, initial encounter: Secondary | ICD-10-CM

## 2013-11-17 DIAGNOSIS — F17200 Nicotine dependence, unspecified, uncomplicated: Secondary | ICD-10-CM | POA: Insufficient documentation

## 2013-11-17 DIAGNOSIS — S199XXA Unspecified injury of neck, initial encounter: Secondary | ICD-10-CM

## 2013-11-17 DIAGNOSIS — Z8659 Personal history of other mental and behavioral disorders: Secondary | ICD-10-CM | POA: Insufficient documentation

## 2013-11-17 DIAGNOSIS — S0120XA Unspecified open wound of nose, initial encounter: Secondary | ICD-10-CM | POA: Insufficient documentation

## 2013-11-17 DIAGNOSIS — S0181XA Laceration without foreign body of other part of head, initial encounter: Secondary | ICD-10-CM

## 2013-11-17 DIAGNOSIS — S022XXB Fracture of nasal bones, initial encounter for open fracture: Secondary | ICD-10-CM | POA: Insufficient documentation

## 2013-11-17 DIAGNOSIS — S0180XA Unspecified open wound of other part of head, initial encounter: Secondary | ICD-10-CM | POA: Insufficient documentation

## 2013-11-17 DIAGNOSIS — Y92009 Unspecified place in unspecified non-institutional (private) residence as the place of occurrence of the external cause: Secondary | ICD-10-CM | POA: Insufficient documentation

## 2013-11-17 MED ORDER — AMOXICILLIN-POT CLAVULANATE 875-125 MG PO TABS: 1.0000 | ORAL_TABLET | Freq: Two times a day (BID) | ORAL | Status: DC

## 2013-11-17 MED ORDER — LIDOCAINE-EPINEPHRINE (PF) 2 %-1:200000 IJ SOLN
INTRAMUSCULAR | Status: AC
  Administered 2013-11-17: 20 mL
  Filled 2013-11-17: qty 20

## 2013-11-17 MED ORDER — LIDOCAINE-EPINEPHRINE (PF) 2 %-1:200000 IJ SOLN: 20.0000 mL | Freq: Once | INTRAMUSCULAR | Status: DC

## 2013-11-17 MED ORDER — TETANUS-DIPHTH-ACELL PERTUSSIS 5-2.5-18.5 LF-MCG/0.5 IM SUSP
0.5000 mL | Freq: Once | INTRAMUSCULAR | Status: AC
  Administered 2013-11-17: 0.5 mL via INTRAMUSCULAR

## 2013-11-17 MED ORDER — AMOXICILLIN-POT CLAVULANATE 875-125 MG PO TABS
1.0000 | ORAL_TABLET | Freq: Once | ORAL | Status: AC
  Administered 2013-11-17: 1 via ORAL
  Filled 2013-11-17: qty 1

## 2013-11-17 MED ORDER — DIPHTH-ACELL PERTUSSIS-TETANUS 25-58-10 LF-MCG/0.5 IM SUSP
0.5000 mL | Freq: Once | INTRAMUSCULAR | Status: DC
  Filled 2013-11-17: qty 0.5

## 2013-11-17 NOTE — Discharge Instructions (Signed)
Facial Laceration ° A facial laceration is a cut on the face. These injuries can be painful and cause bleeding. Lacerations usually heal quickly, but they need special care to reduce scarring. °DIAGNOSIS  °Your health care provider will take a medical history, ask for details about how the injury occurred, and examine the wound to determine how deep the cut is. °TREATMENT  °Some facial lacerations may not require closure. Others may not be able to be closed because of an increased risk of infection. The risk of infection and the chance for successful closure will depend on various factors, including the amount of time since the injury occurred. °The wound may be cleaned to help prevent infection. If closure is appropriate, pain medicines may be given if needed. Your health care provider will use stitches (sutures), wound glue (adhesive), or skin adhesive strips to repair the laceration. These tools bring the skin edges together to allow for faster healing and a better cosmetic outcome. If needed, you may also be given a tetanus shot. °HOME CARE INSTRUCTIONS °· Only take over-the-counter or prescription medicines as directed by your health care provider. °· Follow your health care provider's instructions for wound care. These instructions will vary depending on the technique used for closing the wound. °For Sutures: °· Keep the wound clean and dry.   °· If you were given a bandage (dressing), you should change it at least once a day. Also change the dressing if it becomes wet or dirty, or as directed by your health care provider.   °· Wash the wound with soap and water 2 times a day. Rinse the wound off with water to remove all soap. Pat the wound dry with a clean towel.   °· After cleaning, apply a thin layer of the antibiotic ointment recommended by your health care provider. This will help prevent infection and keep the dressing from sticking.   °· You may shower as usual after the first 24 hours. Do not soak the  wound in water until the sutures are removed.   °· Get your sutures removed as directed by your health care provider. With facial lacerations, sutures should usually be taken out after 4-5 days to avoid stitch marks.   °· Wait a few days after your sutures are removed before applying any makeup. °For Skin Adhesive Strips: °· Keep the wound clean and dry.   °· Do not get the skin adhesive strips wet. You may bathe carefully, using caution to keep the wound dry.   °· If the wound gets wet, pat it dry with a clean towel.   °· Skin adhesive strips will fall off on their own. You may trim the strips as the wound heals. Do not remove skin adhesive strips that are still stuck to the wound. They will fall off in time.   °For Wound Adhesive: °· You may briefly wet your wound in the shower or bath. Do not soak or scrub the wound. Do not swim. Avoid periods of heavy sweating until the skin adhesive has fallen off on its own. After showering or bathing, gently pat the wound dry with a clean towel.   °· Do not apply liquid medicine, cream medicine, ointment medicine, or makeup to your wound while the skin adhesive is in place. This may loosen the film before your wound is healed.   °· If a dressing is placed over the wound, be careful not to apply tape directly over the skin adhesive. This may cause the adhesive to be pulled off before the wound is healed.   °· Avoid   prolonged exposure to sunlight or tanning lamps while the skin adhesive is in place.  The skin adhesive will usually remain in place for 5-10 days, then naturally fall off the skin. Do not pick at the adhesive film.  After Healing: Once the wound has healed, cover the wound with sunscreen during the day for 1 full year. This can help minimize scarring. Exposure to ultraviolet light in the first year will darken the scar. It can take 1-2 years for the scar to lose its redness and to heal completely.  SEEK IMMEDIATE MEDICAL CARE IF:  You have redness, pain, or  swelling around the wound.   You see ayellowish-white fluid (pus) coming from the wound.   You have chills or a fever.  MAKE SURE YOU:  Understand these instructions.  Will watch your condition.  Will get help right away if you are not doing well or get worse. Document Released: 05/03/2004 Document Revised: 01/14/2013 Document Reviewed: 11/06/2012 [facility] Patient Information 2015 [place], [place]. This information is not intended to replace advice given to you by your health care provider. Make sure you discuss any questions you have with your health care provider.   Sutures out next Tuesday.   Ice pack.   Antibiotic for one week. First dose given tonight. Tylenol or ibuprofen for pain. Return for infection. Referral to ear nose and throat Dr.

## 2013-11-17 NOTE — ED Notes (Signed)
Pt reports was walking down steps last night around 2000, states woke up around 1230 today, lying in bed with lac to forehead and bridge of nose.  Pt also reports knot to back of head.  C/o neck pain.  Pt alert and oriented x 4 now.  Reports drinking 1 beer yesterday.

## 2013-11-17 NOTE — ED Notes (Signed)
Patient verbalizes understanding of discharge instructions and prescription medications. Patient ambulatory out of department at this time with family.

## 2013-11-17 NOTE — ED Notes (Signed)
Patient resting in a position of comfort. No needs voiced at this time, family member at bedside.

## 2013-11-19 DIAGNOSIS — S51819A Laceration without foreign body of unspecified forearm, initial encounter: Secondary | ICD-10-CM | POA: Diagnosis present

## 2013-11-19 NOTE — ED Provider Notes (Signed)
CSN: [id number]     Arrival date & time 11/17/13  1627 History   First MD Initiated Contact with Patient 11/17/13 1700     Chief Complaint  Patient presents with  . Fall     (Consider location/radiation/quality/duration/timing/severity/associated sxs/prior Treatment) HPI.... status post accidental fall down steps at home last night approximately 2000.   Complains of laceration to forehead, nose.  Patient is amnestic to the event. No neck pain or neurological deficits. Severity is moderate. Palpation makes symptoms worse.  Past Medical History  Diagnosis Date  . Anxiety   . Depression    Past Surgical History  Procedure Laterality Date  . Esophagogastroduodenoscopy  05/03/2006    Mild erythema of the body and antrum/normal esophagus without erythema, erosion or ulceration.  No evidence of Barrett's.  Normal duodenal bulb and second portion of the duodenum.  . Skin biopsy Left 5/10    Removal of benign cyst on left cheek   No family history on file. History  Substance Use Topics  . Smoking status: Current Every Day Smoker -- 0.75 packs/day for 23 years    Types: Cigarettes  . Smokeless tobacco: Not on file  . Alcohol Use: 10.8 oz/week    18 Cans of beer per week    Review of Systems  All other systems reviewed and are negative.     Allergies  Review of patient's allergies indicates no known allergies.  Home Medications   Prior to Admission medications   Medication Sig Start Date End Date Taking? Authorizing Provider  amoxicillin-clavulanate (AUGMENTIN) 875-125 MG per tablet Take 1 tablet by mouth 2 (two) times daily. One po bid x 7 days 11/17/13   Nat Christen, MD   BP 112/80  Pulse 73  Temp(Src) 98.3 F (36.8 C) (Oral)  Resp 18  Ht 5\' 8"  (1.727 m)  Wt 160 lb (72.576 kg)  BMI 24.33 kg/m2  SpO2 96% Physical Exam  Nursing note and vitals reviewed. Constitutional: He is oriented to person, place, and time. He appears well-developed and well-nourished.  HENT:   2.75 cm horizontal laceration on the midforehead.  1 cm laceration on bridge of nose.  Eyes: Conjunctivae and EOM are normal. Pupils are equal, round, and reactive to light.  Neck: Normal range of motion. Neck supple.  Cardiovascular: Normal rate, regular rhythm and normal heart sounds.   Pulmonary/Chest: Effort normal and breath sounds normal.  Abdominal: Soft. Bowel sounds are normal.  Musculoskeletal: Normal range of motion.  Neurological: He is alert and oriented to person, place, and time.  Skin: Skin is warm and dry.  Psychiatric: He has a normal mood and affect. His behavior is normal.    ED Course  LACERATION REPAIR Date/Time: 11/19/2013 3:54 PM Performed by: Nat Christen Authorized by: Nat Christen Comments: 2.75 cm laceration on forehead. 1 cm laceration on bridge of nose.  Total length of lacerations 3.75 cm. Wounds were complex. Copious irrigation. Wound edges were trimmed.. Small amount of foreign material removed.   Anesthesia with 2% Xylocaine with epinephrine x8 cc. Wound repair with simple interrupted sutures 5-0 Ethilon x12 total. Patient tolerated procedure well.   (including critical care time) Labs Review Labs Reviewed - No data to display  Imaging Review Ct Head Wo Contrast  11/17/2013   CLINICAL DATA:  Fall. Head trauma. Loss of consciousness. Facial pain, swelling, and laceration.  EXAM: CT HEAD WITHOUT CONTRAST  CT MAXILLOFACIAL WITHOUT CONTRAST  TECHNIQUE: Multidetector CT imaging of the head and maxillofacial structures were performed using the  standard protocol without intravenous contrast. Multiplanar CT image reconstructions of the maxillofacial structures were also generated.  COMPARISON:  None.  FINDINGS: CT HEAD FINDINGS  No evidence of intracranial hemorrhage, brain edema, or other signs of acute infarction. No evidence of intracranial mass lesion or mass effect. No abnormal extraaxial fluid collections identified. Ventricles are normal in size. No skull  abnormality identified.  CT MAXILLOFACIAL FINDINGS  Nondepressed distal nasal bone fracture demonstrated. No other facial bone fractures or orbital fractures identified.  The globes and other intraorbital anatomy are normal in appearance. No evidence of orbital emphysema or sinus air-fluid levels. Mucosal thickening is seen involving the ethmoid and maxillary sinuses bilaterally, consistent chronic sinusitis.  IMPRESSION: Negative noncontrast head CT.  Nondepressed distal nasal bone fracture. No other maxillofacial fractures identified.   Electronically Signed   By: Earle Gell M.D.   On: 11/17/2013 18:11   Ct Maxillofacial Wo Cm  11/17/2013   CLINICAL DATA:  Fall. Head trauma. Loss of consciousness. Facial pain, swelling, and laceration.  EXAM: CT HEAD WITHOUT CONTRAST  CT MAXILLOFACIAL WITHOUT CONTRAST  TECHNIQUE: Multidetector CT imaging of the head and maxillofacial structures were performed using the standard protocol without intravenous contrast. Multiplanar CT image reconstructions of the maxillofacial structures were also generated.  COMPARISON:  None.  FINDINGS: CT HEAD FINDINGS  No evidence of intracranial hemorrhage, brain edema, or other signs of acute infarction. No evidence of intracranial mass lesion or mass effect. No abnormal extraaxial fluid collections identified. Ventricles are normal in size. No skull abnormality identified.  CT MAXILLOFACIAL FINDINGS  Nondepressed distal nasal bone fracture demonstrated. No other facial bone fractures or orbital fractures identified.  The globes and other intraorbital anatomy are normal in appearance. No evidence of orbital emphysema or sinus air-fluid levels. Mucosal thickening is seen involving the ethmoid and maxillary sinuses bilaterally, consistent chronic sinusitis.  IMPRESSION: Negative noncontrast head CT.  Nondepressed distal nasal bone fracture. No other maxillofacial fractures identified.   Electronically Signed   By: Earle Gell M.D.   On:  11/17/2013 18:11     EKG Interpretation None      MDM   Final diagnoses:  Fall, initial encounter  Laceration of nose, initial encounter  Fracture, nasal, open, initial encounter  Laceration of forehead, initial encounter    Status post fall. CT head and CT maxillofacial showed nasal bone fracture. No brain injury.  Laceration repair total 3.75 cm.    Referral to an otolaryngologist.  Sutures out in 6 days. Tetanus booster.  Discharge medications Augmentin    Nat Christen, MD 11/19/13 1557

## 2014-12-27 ENCOUNTER — Encounter (HOSPITAL_COMMUNITY): Payer: Self-pay | Admitting: Emergency Medicine

## 2014-12-27 ENCOUNTER — Emergency Department (HOSPITAL_COMMUNITY)
Admission: EM | Admit: 2014-12-27 | Discharge: 2014-12-27 | Disposition: A | Discharge status: Home / Self Care | Payer: Self-pay | Attending: Emergency Medicine | Admitting: Emergency Medicine

## 2014-12-27 ENCOUNTER — Emergency Department (HOSPITAL_COMMUNITY): Payer: Self-pay

## 2014-12-27 DIAGNOSIS — Z8659 Personal history of other mental and behavioral disorders: Secondary | ICD-10-CM | POA: Insufficient documentation

## 2014-12-27 DIAGNOSIS — R0602 Shortness of breath: Secondary | ICD-10-CM | POA: Insufficient documentation

## 2014-12-27 DIAGNOSIS — Z720 Tobacco use: Secondary | ICD-10-CM | POA: Insufficient documentation

## 2014-12-27 DIAGNOSIS — R079 Chest pain, unspecified: Secondary | ICD-10-CM | POA: Insufficient documentation

## 2014-12-27 DIAGNOSIS — M549 Dorsalgia, unspecified: Secondary | ICD-10-CM | POA: Insufficient documentation

## 2014-12-27 LAB — BASIC METABOLIC PANEL
Anion gap: 10 (ref 5–15)
BUN: 11 mg/dL (ref 6–20)
CO2: 25 mmol/L (ref 22–32)
CREATININE: 1.09 mg/dL (ref 0.61–1.24)
Calcium: 9.2 mg/dL (ref 8.9–10.3)
Chloride: 102 mmol/L (ref 101–111)
GFR calc Af Amer: >60 mL/min (ref >60)
GFR calc non Af Amer: >60 mL/min (ref >60)
Glucose, Bld: 108 mg/dL — ABNORMAL HIGH (ref 65–99)
POTASSIUM: 4.4 mmol/L (ref 3.5–5.1)
SODIUM: 137 mmol/L (ref 135–145)

## 2014-12-27 LAB — CBC
HEMATOCRIT: 48.3 % (ref 39.0–52.0)
Hemoglobin: 17 g/dL (ref 13.0–17.0)
MCH: 35.3 pg — ABNORMAL HIGH (ref 26.0–34.0)
MCHC: 35.2 g/dL (ref 30.0–36.0)
MCV: 100.4 fL — ABNORMAL HIGH (ref 78.0–100.0)
Platelets: 228 10*3/uL (ref 150–400)
RBC: 4.81 MIL/uL (ref 4.22–5.81)
RDW: 12.4 % (ref 11.5–15.5)
WBC: 13.6 10*3/uL — AB (ref 4.0–10.5)

## 2014-12-27 LAB — I-STAT TROPONIN, ED
Troponin i, poc: 0 ng/mL (ref 0.00–0.08)
Troponin i, poc: 0.01 ng/mL (ref 0.00–0.08)

## 2014-12-27 LAB — D-DIMER, QUANTITATIVE (NOT AT ARMC)

## 2014-12-27 MED ORDER — HYDROCODONE-ACETAMINOPHEN 5-325 MG PO TABS: 1.0000 | ORAL_TABLET | Freq: Four times a day (QID) | ORAL | Status: AC | PRN

## 2014-12-27 MED ORDER — MORPHINE SULFATE (PF) 2 MG/ML IV SOLN
2.0000 mg | Freq: Once | INTRAVENOUS | Status: AC
  Administered 2014-12-27: 2 mg via INTRAVENOUS
  Filled 2014-12-27: qty 1

## 2014-12-27 MED ORDER — ONDANSETRON HCL 4 MG/2ML IJ SOLN
4.0000 mg | Freq: Once | INTRAMUSCULAR | Status: AC
  Administered 2014-12-27: 4 mg via INTRAVENOUS
  Filled 2014-12-27: qty 2

## 2014-12-27 MED ORDER — SODIUM CHLORIDE 0.9 % IV SOLN
INTRAVENOUS | Status: DC
  Administered 2014-12-27: 19:00:00 via INTRAVENOUS

## 2014-12-27 MED ORDER — IOHEXOL 350 MG/ML SOLN
100.0000 mL | Freq: Once | INTRAVENOUS | Status: AC | PRN
Start: 2014-12-27 — End: 2014-12-27
  Administered 2014-12-27: 100 mL via INTRAVENOUS

## 2014-12-27 MED ORDER — ASPIRIN 81 MG PO CHEW
324.0000 mg | CHEWABLE_TABLET | Freq: Once | ORAL | Status: AC
  Administered 2014-12-27: 324 mg via ORAL
  Filled 2014-12-27: qty 4

## 2014-12-27 MED ORDER — PROMETHAZINE HCL 25 MG PO TABS: 25.0000 mg | ORAL_TABLET | Freq: Four times a day (QID) | ORAL | Status: AC | PRN

## 2014-12-27 MED ORDER — NITROGLYCERIN 0.4 MG SL SUBL
0.4000 mg | SUBLINGUAL_TABLET | SUBLINGUAL | Status: DC | PRN
  Administered 2014-12-27 (×3): 0.4 mg via SUBLINGUAL
  Filled 2014-12-27: qty 1

## 2014-12-27 MED ORDER — FENTANYL CITRATE (PF) 100 MCG/2ML IJ SOLN
50.0000 ug | Freq: Once | INTRAMUSCULAR | Status: AC
  Administered 2014-12-27: 50 ug via INTRAVENOUS
  Filled 2014-12-27: qty 2

## 2014-12-27 MED ORDER — SODIUM CHLORIDE 0.9 % IV BOLUS (SEPSIS)
250.0000 mL | Freq: Once | INTRAVENOUS | Status: AC
  Administered 2014-12-27: 250 mL via INTRAVENOUS

## 2014-12-27 MED ORDER — HYDROMORPHONE HCL 1 MG/ML IJ SOLN
1.0000 mg | Freq: Once | INTRAMUSCULAR | Status: AC
  Administered 2014-12-27: 1 mg via INTRAVENOUS
  Filled 2014-12-27: qty 1

## 2014-12-27 NOTE — ED Notes (Signed)
Pt reports waking this am with arm pain that eventually developed into chest pain with radiation up into his jaws and into his back.

## 2014-12-27 NOTE — Discharge Instructions (Signed)
Workup for the chest pain without any specific findings. However would recommend starting a baby aspirin a day distal to the year family history of early heart problems. Also would recommend following up with her regular doctor resource guide provided to help you find one. In the short run take pain medicine and antinausea medicine and needed. 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 Return for development of any new or worse symptoms.

## 2014-12-27 NOTE — ED Provider Notes (Addendum)
CSN: [id number]     Arrival date & time 12/27/14  1755 History   First MD Initiated Contact with Patient 12/27/14 1804     Chief Complaint  Patient presents with  . Chest Pain     (Consider location/radiation/quality/duration/timing/severity/associated sxs/prior Treatment) Patient is a 44 y.o. male presenting with chest pain. The history is provided by the patient.  Chest Pain Associated symptoms: back pain and shortness of breath   Associated symptoms: no abdominal pain, no fever, no headache, no nausea and not vomiting    patient awoke this morning at about 6:30 with right arm pain. That moved to right shoulder then moved to anterior bilateral chest with radiation to the back associated with shortness of breath some nausea and vomiting. It moved from the right arm at about 9:30 in the morning. Primary care doctors Dr. Emilee Hero. Cardiac risk factors is that he is a smoker and that his sister had a heart attack before age 69. The pain is described as severe 8 out of 10 sharp and pressure in nature.  Past Medical History  Diagnosis Date  . Anxiety   . Depression    Past Surgical History  Procedure Laterality Date  . Esophagogastroduodenoscopy  05/03/2006    Mild erythema of the body and antrum/normal esophagus without erythema, erosion or ulceration.  No evidence of Barrett's.  Normal duodenal bulb and second portion of the duodenum.  . Skin biopsy Left 5/10    Removal of benign cyst on left cheek   Family History  Problem Relation Age of Onset  . Heart failure Other    Social History  Substance Use Topics  . Smoking status: Current Every Day Smoker -- 1.00 packs/day for 23 years    Types: Cigarettes  . Smokeless tobacco: Never Used  . Alcohol Use: 10.8 oz/week    18 Cans of beer per week    Review of Systems  Constitutional: Negative for fever.  HENT: Negative for congestion.   Eyes: Negative for visual disturbance.  Respiratory: Positive for shortness of breath.    Cardiovascular: Positive for chest pain. Negative for leg swelling.  Gastrointestinal: Negative for nausea, vomiting and abdominal pain.  Genitourinary: Negative for dysuria.  Musculoskeletal: Positive for back pain.  Skin: Negative for rash.  Neurological: Negative for headaches.  Hematological: Does not bruise/bleed easily.  Psychiatric/Behavioral: Negative for confusion.      Allergies  Review of patient's allergies indicates no known allergies.  Home Medications   Prior to Admission medications   Medication Sig Start Date End Date Taking? Authorizing Provider  acetaminophen (TYLENOL) 500 MG tablet Take 1,000 mg by mouth every 6 (six) hours as needed.   Yes Historical Provider, MD   BP 118/79 mmHg  Pulse 83  Temp(Src) 98.2 F (36.8 C) (Oral)  Resp 18  Ht 5\' 8"  (1.727 m)  Wt 175 lb (79.379 kg)  BMI 26.61 kg/m2  SpO2 100% Physical Exam  Constitutional: He is oriented to person, place, and time. He appears well-developed and well-nourished. No distress.  HENT:  Head: Normocephalic and atraumatic.  Mouth/Throat: Oropharynx is clear and moist.  Eyes: Conjunctivae and EOM are normal. Pupils are equal, round, and reactive to light.  Neck: Normal range of motion. Neck supple.  Cardiovascular: Normal rate, regular rhythm and normal heart sounds.   No murmur heard. Pulmonary/Chest: Effort normal and breath sounds normal. No respiratory distress. He exhibits no tenderness.  Abdominal: Soft. Bowel sounds are normal. There is no tenderness.  Musculoskeletal: Normal range of  motion. He exhibits no edema.  Neurological: He is alert and oriented to person, place, and time. No cranial nerve deficit. He exhibits normal muscle tone. Coordination normal.  Skin: Skin is warm. No rash noted.  Nursing note and vitals reviewed.   ED Course  Procedures (including critical care time) Labs Review Labs Reviewed  CBC - Abnormal; Notable for the following:    WBC 13.6 (*)    MCV 100.4 (*)     MCH 35.3 (*)    All other components within normal limits  BASIC METABOLIC PANEL - Abnormal; Notable for the following:    Glucose, Bld 108 (*)    All other components within normal limits  D-DIMER, QUANTITATIVE (NOT AT [facility])  I-STAT TROPOININ, ED  I-STAT TROPOININ, ED   Results for orders placed or performed during the hospital encounter of 12/27/14  CBC  Result Value Ref Range   WBC 13.6 (H) 4.0 - 10.5 K/uL   RBC 4.81 4.22 - 5.81 MIL/uL   Hemoglobin 17.0 13.0 - 17.0 g/dL   HCT 48.3 39.0 - 52.0 %   MCV 100.4 (H) 78.0 - 100.0 fL   MCH 35.3 (H) 26.0 - 34.0 pg   MCHC 35.2 30.0 - 36.0 g/dL   RDW 12.4 11.5 - 15.5 %   Platelets 228 150 - 400 K/uL  Basic metabolic panel  Result Value Ref Range   Sodium 137 135 - 145 mmol/L   Potassium 4.4 3.5 - 5.1 mmol/L   Chloride 102 101 - 111 mmol/L   CO2 25 22 - 32 mmol/L   Glucose, Bld 108 (H) 65 - 99 mg/dL   BUN 11 6 - 20 mg/dL   Creatinine, Ser 1.09 0.61 - 1.24 mg/dL   Calcium 9.2 8.9 - 10.3 mg/dL   GFR calc non Af Amer >60 >60 mL/min   GFR calc Af Amer >60 >60 mL/min   Anion gap 10 5 - 15  D-dimer, quantitative (not at [facility])  Result Value Ref Range   D-Dimer, Quant <0.27 0.00 - 0.48 ug/mL-FEU  I-stat troponin, ED (not at [facility])  Result Value Ref Range   Troponin i, poc 0.00 0.00 - 0.08 ng/mL   Comment 3             Imaging Review Ct Angio Chest Pe W/cm &/or Wo Cm  12/27/2014   CLINICAL DATA:  The patient woke up today with right arm pain, now radiating into the back and jaw.  EXAM: CT ANGIOGRAPHY CHEST WITH CONTRAST  TECHNIQUE: Multidetector CT imaging of the chest was performed using the standard protocol during bolus administration of intravenous contrast. Multiplanar CT image reconstructions and MIPs were obtained to evaluate the vascular anatomy.  CONTRAST:  167mL OMNIPAQUE IOHEXOL 350 MG/ML SOLN  COMPARISON:  10/05/2011, 12/27/2014  FINDINGS: Heart: There is atherosclerotic calcification of the coronary arteries. No  pericardial effusion. Heart size is normal.  Vascular structures: Pulmonary arteries are moderately well opacified by contrast bolus. There is no evidence for acute pulmonary embolus. Very anatomy of the aortic arch. The left common carotid artery has a unique origin from the arch. Otherwise the thoracic aorta has a normal appearance.  Mediastinum/thyroid: The visualized portion of the thyroid gland has a normal appearance. No mediastinal, hilar, or axillary adenopathy.  Lungs/Airways: There is right basilar atelectasis. No nodules, pleural effusions, or infiltrates. Within the lower trachea there is a small amount of mucus, best seen on coronal image number 43 of series 6. Otherwise the airways are patent.  Upper abdomen: Unremarkable.  Chest wall/osseous structures: Moderate mid thoracic spondylosis. No suspicious lytic or blastic lesions are identified.  Review of the MIP images confirms the above findings.  IMPRESSION: 1. Technically adequate exam showing no pulmonary embolus. 2. Right lower lobe atelectasis. 3. Probable mucus within the lower trachea. 4. Atherosclerotic calcification of the coronary vessels. 5. Variant arch anatomy.   Electronically Signed   By: Nolon Nations M.D.   On: 12/27/2014 20:16   Dg Chest Portable 1 View  12/27/2014   CLINICAL DATA:  Acute chest pain today.  EXAM: PORTABLE CHEST - 1 VIEW  COMPARISON:  10/05/2011 and 01/25/2006 chest radiographs  FINDINGS: The cardiomediastinal silhouette is unremarkable.  Mild elevation of the right hemidiaphragm is again noted.  There is no evidence of focal airspace disease, pulmonary edema, suspicious pulmonary nodule/mass, pleural effusion, or pneumothorax. No acute bony abnormalities are identified.  IMPRESSION: No active disease.   Electronically Signed   By: Margarette Canada M.D.   On: 12/27/2014 18:39   I have personally reviewed and evaluated these images and lab results as part of my medical decision-making.   EKG  Interpretation   Date/Time:  Monday December 27 2014 18:03:17 EDT Ventricular Rate:  93 PR Interval:  162 QRS Duration: 92 QT Interval:  343 QTC Calculation: 427 R Axis:   16 Text Interpretation:  Sinus rhythm Probable left atrial enlargement RSR'  in V1 or V2, right VCD or RVH ST elev, probable normal early repol pattern  No significant change since last tracing Confirmed by Alynah Schone  MD, Neftali  [phone]) on 12/27/2014 6:21:40 PM      MDM   Final diagnoses:  Chest pain, unspecified chest pain type    Patient with severe chest pain and back pain between the shoulder blade starting around 9:30 this morning. Awoke with pain in the right arm at about 6:30 in the morning. Extensive workup here first troponin negative EKG just early repolarization findings no evidence of any acute changes. Chest x-ray was negative CT angios chest negative for pulmonary embolus or any other acute findings. Second troponin is pending if negative patient can be discharged home.  Labs without significant findings. Patient's blood pressure was initially elevated but as the chest discomfort was treated first with aspirin nitroglycerin and morphine and then fentanyl blood pressure came down to very reasonable 118/79.  Patient will be started on a baby aspirin a day.  Fredia Sorrow, MD 12/27/14 2058   Second troponin without any significant change.  Fredia Sorrow, MD 12/27/14 2125  In addition repeat EKG was done at the time of discharge no acute changes compared to the one done when he first came to the emergency department.  Fredia Sorrow, MD 12/27/14 2158

## 2015-01-12 ENCOUNTER — Emergency Department (HOSPITAL_COMMUNITY)
Admission: EM | Admit: 2015-01-12 | Discharge: 2015-01-13 | Disposition: A | Discharge status: Other Acute Inpatient Hospital | Payer: Self-pay | Attending: Emergency Medicine | Admitting: Emergency Medicine

## 2015-01-12 ENCOUNTER — Emergency Department (HOSPITAL_COMMUNITY): Admission: EM | Admit: 2015-01-12 | Discharge: 2015-01-12 | Discharge status: Absent without leave | Payer: Self-pay

## 2015-01-12 ENCOUNTER — Encounter (HOSPITAL_COMMUNITY): Payer: Self-pay | Admitting: *Deleted

## 2015-01-12 DIAGNOSIS — R0602 Shortness of breath: Secondary | ICD-10-CM | POA: Insufficient documentation

## 2015-01-12 DIAGNOSIS — B349 Viral infection, unspecified: Secondary | ICD-10-CM

## 2015-01-12 NOTE — ED Notes (Signed)
Pt states that he generalized body pain for 3 weeks; pt states that he was seen at [facility] and was diagnosed with non cardiac chest pain and myalgias; pt states that he is unable to sleep or rest due to pain; pt c/o nausea; pt states that he has pain to chest, back, legs and face

## 2015-01-12 NOTE — ED Notes (Signed)
C/o generalized body aches that began 3 weeks ago associated with fatigue, SOB and worsening SOB when lying flat. Breath sounds clear bilaterally. Nothing makes pain better, nothing makes pain better, states the pain is severe and he is unable to sleep. None of the medications prescribed have helped.

## 2015-01-12 NOTE — ED Provider Notes (Signed)
CSN: [id number]     Arrival date & time 01/12/15  2056 History  By signing my name below, I, Randy Wu, attest that this documentation has been prepared under the direction and in the presence of Varney Biles, MD. Electronically Signed: Judithann Wu, ED Scribe. 01/13/2015. 12:36 AM.    Chief Complaint  Patient presents with  . Generalized Body Aches   The history is provided by the patient. No language interpreter was used.   HPI Comments: Randy Wu is a 44 y.o. male who presents to the Emergency Department complaining of waxing and waning sharp generalized body aches onset 3 weeks ago. He reports associated pain to the torso, right arm, and bilateral knees. He also reports loss of appetite and nighttime sweats yesterday. He adds that he has difficulty breathing due to the pain sometimes. He reports that he normally has to sleep propped up since he wakes up in the middle of the night with SOB. His SOB is also worse on exertion. He denies any URI symptoms, fever, nasal congestion, or rash. He denies any unexpected weight loss. 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 He denies any sick contacts. He states that he has been smoking about one pack of cigarettes a day since he was about 44 years old. He states that he was seen at [facility] and was told that he could possibly have Shingles.   Past Medical History  Diagnosis Date  . Anxiety   . Depression    Past Surgical History  Procedure Laterality Date  . Esophagogastroduodenoscopy  05/03/2006    Mild erythema of the body and antrum/normal esophagus without erythema, erosion or ulceration.  No evidence of Barrett's.  Normal duodenal bulb and second portion of the duodenum.  . Skin biopsy Left 5/10    Removal of benign cyst on left cheek   Family History  Problem Relation Age of Onset  . Heart failure Other    Social History  Substance Use Topics  . Smoking status: Current  Every Day Smoker -- 1.00 packs/day for 23 years    Types: Cigarettes  . Smokeless tobacco: Never Used  . Alcohol Use: 10.8 oz/week    18 Cans of beer per week    Review of Systems  Constitutional: Negative for fever, chills and appetite change.  HENT: Negative for congestion.   Respiratory: Positive for shortness of breath.   Musculoskeletal: Positive for myalgias and arthralgias.  Skin: Negative for rash.      Allergies  Review of patient's allergies indicates no known allergies.  Home Medications   Prior to Admission medications   Medication Sig Start Date End Date Taking? Authorizing Provider  acetaminophen (TYLENOL) 500 MG tablet Take 1,000 mg by mouth every 6 (six) hours as needed.   Yes Historical Provider, MD  promethazine (PHENERGAN) 25 MG tablet Take 1 tablet (25 mg total) by mouth every 6 (six) hours as needed. 12/27/14  Yes Fredia Sorrow, MD  cephALEXin (KEFLEX) 500 MG capsule Take 1 capsule (500 mg total) by mouth 4 (four) times daily. 01/13/15   Varney Biles, MD  HYDROcodone-acetaminophen (NORCO/VICODIN) 5-325 MG per tablet Take 1-2 tablets by mouth every 6 (six) hours as needed. Patient not taking: Reported on 01/13/2015 12/27/14   Fredia Sorrow, MD  ibuprofen (ADVIL,MOTRIN) 600 MG tablet Take 1 tablet (600 mg total) by mouth every 6 (six) hours as needed. 01/13/15   Varney Biles, MD  sulfamethoxazole-trimethoprim (BACTRIM DS,SEPTRA DS)  800-160 MG tablet Take 1 tablet by mouth 2 (two) times daily. 01/13/15 01/20/15  Nashiya Disbrow, MD   BP 121/72 mmHg  Pulse 95  Temp(Src) 99.1 F (37.3 C) (Oral)  Resp 18  SpO2 96% Physical Exam  Constitutional: He is oriented to person, place, and time. He appears well-developed and well-nourished.  HENT:  Head: Normocephalic and atraumatic.  Cardiovascular: Normal rate and regular rhythm.   Positive JVD  Pulmonary/Chest: Effort normal. He has no rales.  Mild wheezing over upper lung field. No rhonchi or rales.    Abdominal: He exhibits no distension.  Left sided tenderness in the upper quadrant of the abdomen, TTP No splenomegaly appreciated Positive left flank tenderness   Musculoskeletal:  No pitting edema  Neurological: He is alert and oriented to person, place, and time.  Skin: Skin is warm and dry.  Psychiatric: He has a normal mood and affect.  Nursing note and vitals reviewed.   ED Course  Procedures (including critical care time) DIAGNOSTIC STUDIES: Oxygen Saturation is 98% on RA, normal by my interpretation.    COORDINATION OF CARE: 12:33 AM- Pt advised of plan for treatment and pt agrees.    Labs Review Labs Reviewed  CBC WITH DIFFERENTIAL/PLATELET - Abnormal; Notable for the following:    RBC 4.07 (*)    MCV 100.5 (*)    All other components within normal limits  COMPREHENSIVE METABOLIC PANEL - Abnormal; Notable for the following:    Sodium 133 (*)    Glucose, Bld 108 (*)    Calcium 8.8 (*)    All other components within normal limits  CK - Abnormal; Notable for the following:    Total CK 39 (*)    All other components within normal limits  URINALYSIS, ROUTINE W REFLEX MICROSCOPIC (NOT AT [facility])  ROCKY MTN SPOTTED FVR ABS PNL(IGG+IGM)  LYME DISEASE DNA BY PCR(BORRELIA BURG)  I-STAT CG4 LACTIC ACID, ED    Imaging Review No results found. Varney Biles, MD has personally reviewed and evaluated these images and lab results as part of his medical decision-making.   EKG Interpretation None      @4 :45 Pt's results discussed. Advised PCP f/u. The [place] results also discussed, and i printed the results of the [place] and the labs for the patient so that he can show them to his pcp and get HIDA scan if indicated.   MDM   Final diagnoses:  Viral syndrome    I personally performed the services described in this documentation, which was scribed in my presence. The recorded information has been reviewed and is accurate.  Pt comes in with cc of weakness, malaise, low  grade fevers. Symptoms x 3-4 weeks. Pain is generalized. No known tick bites, no travels, no URI like symptoms and no focal infection concerns. Pt has left sided tenderness on exam - in the upper quadrant and flank region. No uti like sx. We will get [place]. He has no hepatomegaly and denies any sore throat - so mono not ordered.     Varney Biles, MD 01/13/15 [phone]

## 2015-01-13 ENCOUNTER — Emergency Department (HOSPITAL_COMMUNITY): Payer: Self-pay

## 2015-01-13 LAB — COMPREHENSIVE METABOLIC PANEL
ALK PHOS: 55 U/L (ref 38–126)
ALT: 40 U/L (ref 17–63)
ANION GAP: 5 (ref 5–15)
AST: 22 U/L (ref 15–41)
Albumin: 3.5 g/dL (ref 3.5–5.0)
BUN: 14 mg/dL (ref 6–20)
CALCIUM: 8.8 mg/dL — AB (ref 8.9–10.3)
CO2: 23 mmol/L (ref 22–32)
CREATININE: 0.94 mg/dL (ref 0.61–1.24)
Chloride: 105 mmol/L (ref 101–111)
GFR calc Af Amer: >60 mL/min (ref >60)
Glucose, Bld: 108 mg/dL — ABNORMAL HIGH (ref 65–99)
Potassium: 4.2 mmol/L (ref 3.5–5.1)
SODIUM: 133 mmol/L — AB (ref 135–145)
TOTAL PROTEIN: 7.4 g/dL (ref 6.5–8.1)
Total Bilirubin: 0.9 mg/dL (ref 0.3–1.2)

## 2015-01-13 LAB — URINALYSIS, ROUTINE W REFLEX MICROSCOPIC
BILIRUBIN URINE: NEGATIVE
Glucose, UA: NEGATIVE mg/dL
HGB URINE DIPSTICK: NEGATIVE
KETONES UR: NEGATIVE mg/dL
Leukocytes, UA: NEGATIVE
NITRITE: NEGATIVE
PROTEIN: NEGATIVE mg/dL
SPECIFIC GRAVITY, URINE: 1.019 (ref 1.005–1.030)
UROBILINOGEN UA: 1 mg/dL (ref 0.0–1.0)
pH: 6.5 (ref 5.0–8.0)

## 2015-01-13 LAB — I-STAT CG4 LACTIC ACID, ED: LACTIC ACID, VENOUS: 0.59 mmol/L (ref 0.5–2.0)

## 2015-01-13 LAB — CBC WITH DIFFERENTIAL/PLATELET
Basophils Absolute: 0 10*3/uL (ref 0.0–0.1)
Basophils Relative: 0 %
EOS ABS: 0.2 10*3/uL (ref 0.0–0.7)
EOS PCT: 2 %
HCT: 40.9 % (ref 39.0–52.0)
HEMOGLOBIN: 13.7 g/dL (ref 13.0–17.0)
LYMPHS ABS: 1.5 10*3/uL (ref 0.7–4.0)
LYMPHS PCT: 15 %
MCH: 33.7 pg (ref 26.0–34.0)
MCHC: 33.5 g/dL (ref 30.0–36.0)
MCV: 100.5 fL — AB (ref 78.0–100.0)
MONOS PCT: 11 %
Monocytes Absolute: 1 10*3/uL (ref 0.1–1.0)
NEUTROS PCT: 72 %
Neutro Abs: 6.9 10*3/uL (ref 1.7–7.7)
Platelets: 275 10*3/uL (ref 150–400)
RBC: 4.07 MIL/uL — AB (ref 4.22–5.81)
RDW: 12 % (ref 11.5–15.5)
WBC: 9.5 10*3/uL (ref 4.0–10.5)

## 2015-01-13 LAB — MONONUCLEOSIS SCREEN: Mono Screen: NEGATIVE

## 2015-01-13 LAB — CK: CK TOTAL: 39 U/L — AB (ref 49–397)

## 2015-01-13 MED ORDER — CEPHALEXIN 500 MG PO CAPS: 500.0000 mg | ORAL_CAPSULE | Freq: Four times a day (QID) | ORAL | Status: DC

## 2015-01-13 MED ORDER — IBUPROFEN 600 MG PO TABS: 600.0000 mg | ORAL_TABLET | Freq: Four times a day (QID) | ORAL | Status: AC | PRN

## 2015-01-13 MED ORDER — SULFAMETHOXAZOLE-TRIMETHOPRIM 800-160 MG PO TABS: 1.0000 | ORAL_TABLET | Freq: Two times a day (BID) | ORAL | Status: DC

## 2015-01-13 MED ORDER — HYDROCODONE-ACETAMINOPHEN 5-325 MG PO TABS
1.0000 | ORAL_TABLET | Freq: Once | ORAL | Status: AC
  Administered 2015-01-13: 1 via ORAL
  Filled 2015-01-13: qty 1

## 2015-01-13 MED ORDER — FENTANYL CITRATE (PF) 100 MCG/2ML IJ SOLN
75.0000 ug | Freq: Once | INTRAMUSCULAR | Status: AC
  Administered 2015-01-13: 75 ug via INTRAVENOUS
  Filled 2015-01-13: qty 2

## 2015-01-13 MED ORDER — SODIUM CHLORIDE 0.9 % IV BOLUS (SEPSIS)
1000.0000 mL | Freq: Once | INTRAVENOUS | Status: AC
  Administered 2015-01-13: 1000 mL via INTRAVENOUS

## 2015-01-13 NOTE — Discharge Instructions (Signed)
We saw you in the ER for THE MALAISE, ACHES, FEVERS. All the results in the ER are normal, labs and imaging. We have sent rersults for tick borne diseases - but those results don't come in right away.  We are not sure what is causing your symptoms. The workup in the ER is not complete, and is limited to screening for life threatening and emergent conditions only, so please see a primary care doctor for further evaluation.  Weakness Weakness is a lack of strength. It may be felt all over the body (generalized) or in one specific part of the body (focal). Some causes of weakness can be serious. You may need further medical evaluation, especially if you are elderly or you have a history of immunosuppression (such as chemotherapy or HIV), kidney disease, heart disease, or diabetes. CAUSES  Weakness can be caused by many different things, including:  Infection.  Physical exhaustion.  Internal bleeding or other blood loss that results in a lack of red blood cells (anemia).  Dehydration. This cause is more common in elderly people.  Side effects or electrolyte abnormalities from medicines, such as pain medicines or sedatives.  Emotional distress, anxiety, or depression.  Circulation problems, especially severe peripheral arterial disease.  Heart disease, such as rapid atrial fibrillation, bradycardia, or heart failure.  Nervous system disorders, such as Guillain-Barr syndrome, multiple sclerosis, or stroke. DIAGNOSIS  To find the cause of your weakness, your caregiver will take your history and perform a physical exam. Lab tests or X-rays may also be ordered, if needed. TREATMENT  Treatment of weakness depends on the cause of your symptoms and can vary greatly. HOME CARE INSTRUCTIONS   Rest as needed.  Eat a well-balanced diet.  Try to get some exercise every day.  Only take over-the-counter or prescription medicines as directed by your caregiver. SEEK MEDICAL CARE IF:   Your  weakness seems to be getting worse or spreads to other parts of your body.  You develop new aches or pains. SEEK IMMEDIATE MEDICAL CARE IF:   You cannot perform your normal daily activities, such as getting dressed and feeding yourself.  You cannot walk up and down stairs, or you feel exhausted when you do so.  You have shortness of breath or chest pain.  You have difficulty moving parts of your body.  You have weakness in only one area of the body or on only one side of the body.  You have a fever.  You have trouble speaking or swallowing.  You cannot control your bladder or bowel movements.  You have black or bloody vomit or stools. MAKE SURE YOU:  Understand these instructions.  Will watch your condition.  Will get help right away if you are not doing well or get worse.   This information is not intended to replace advice given to you by your health care provider. Make sure you discuss any questions you have with your health care provider.   Document Released: 03/26/2005 Document Revised: 09/25/2011 Document Reviewed: 05/25/2011 Elsevier Interactive Patient Education [employer].

## 2015-01-14 LAB — LYME DISEASE DNA BY PCR(BORRELIA BURG): LYME DISEASE(B. BURGDORFERI) PCR: NEGATIVE

## 2015-01-17 LAB — ROCKY MTN SPOTTED FVR ABS PNL(IGG+IGM)
RMSF IgG: POSITIVE — AB
RMSF IgM: 1.54 index — ABNORMAL HIGH (ref 0.00–0.89)

## 2015-01-17 LAB — RMSF, IGG, IFA: RMSF, IGG, IFA: 1:64 {titer} — ABNORMAL HIGH

## 2015-05-11 DEATH — deceased

## 2017-04-21 IMAGING — CT CT ANGIO CHEST
1 of 6 series · 5 of 36 positions shown · IV contrast (Omnipaque 300)
Comparison: 10/05/2011, 12/27/2014

CLINICAL DATA: The patient woke up today with right arm pain, now
radiating into the back and jaw.

EXAM:
CT ANGIOGRAPHY CHEST WITH CONTRAST
TECHNIQUE: Multidetector CT imaging of the chest was performed using the
standard protocol during bolus administration of intravenous
contrast. Multiplanar CT image reconstructions and MIPs were
obtained to evaluate the vascular anatomy.
CONTRAST:  100mL OMNIPAQUE IOHEXOL 350 MG/ML SOLN

[Series 4: pe 3.0 b40f · axial · 0.70mm/px · z∈[-273,-93]mm · 5 of 90 slices shown]
[im 15/90  lung]
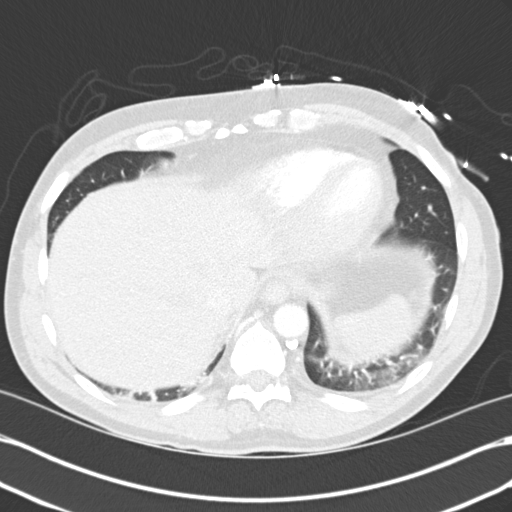
[im 30/90  mediastinal]
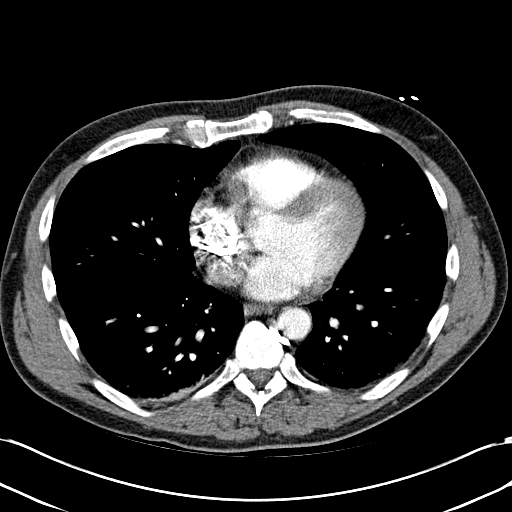
[im 45/90  lung]
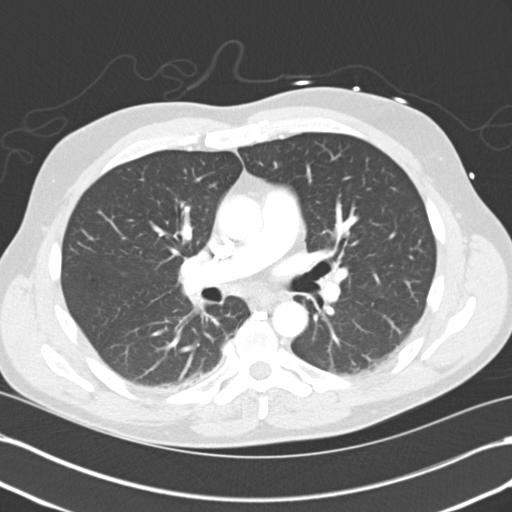
[im 60/90  mediastinal]
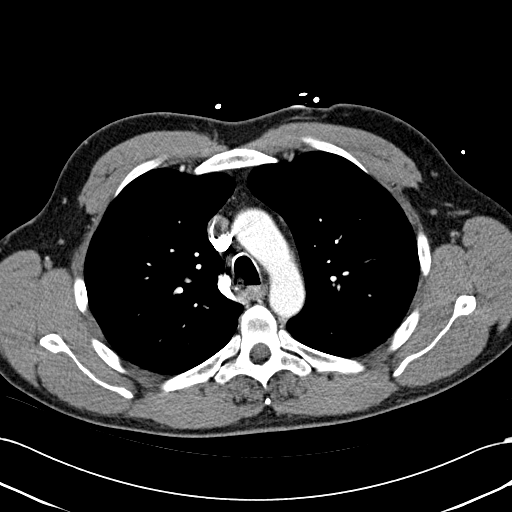
[im 75/90  lung]
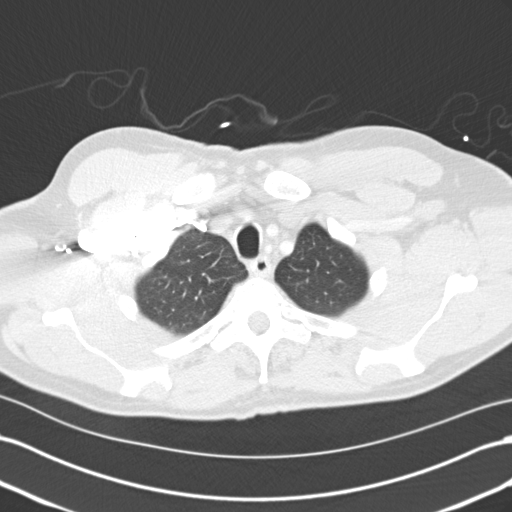

[5 of 36 positions shown; findings below may reference images not displayed]

FINDINGS: Heart: There is atherosclerotic calcification of the coronary
arteries. No pericardial effusion. Heart size is normal.

Vascular structures: Pulmonary arteries are moderately well
opacified by contrast bolus. There is no evidence for acute
pulmonary embolus. Very anatomy of the aortic arch. The left common
carotid artery has a unique origin from the arch. Otherwise the
thoracic aorta has a normal appearance.

Mediastinum/thyroid: The visualized portion of the thyroid gland has
a normal appearance. No mediastinal, hilar, or axillary adenopathy.

Lungs/Airways: There is right basilar atelectasis. No nodules,
pleural effusions, or infiltrates. Within the lower trachea there is
a small amount of mucus, best seen on coronal image number 43 of
series 6. Otherwise the airways are patent.

Upper abdomen: Unremarkable.

Chest wall/osseous structures: Moderate mid thoracic spondylosis. No
suspicious lytic or blastic lesions are identified.

Review of the MIP images confirms the above findings.
IMPRESSION: 1. Technically adequate exam showing no pulmonary embolus.
2. Right lower lobe atelectasis.
3. Probable mucus within the lower trachea.
4. Atherosclerotic calcification of the coronary vessels.
5. Variant arch anatomy.

## 2017-04-21 IMAGING — CR DG CHEST 1V PORT
1 series · 1 of 1 positions shown · non-contrast
Comparison: 10/05/2011 and 01/25/2006 chest radiographs

CLINICAL DATA: Acute chest pain today.

EXAM:
PORTABLE CHEST - 1 VIEW

[ap portable]
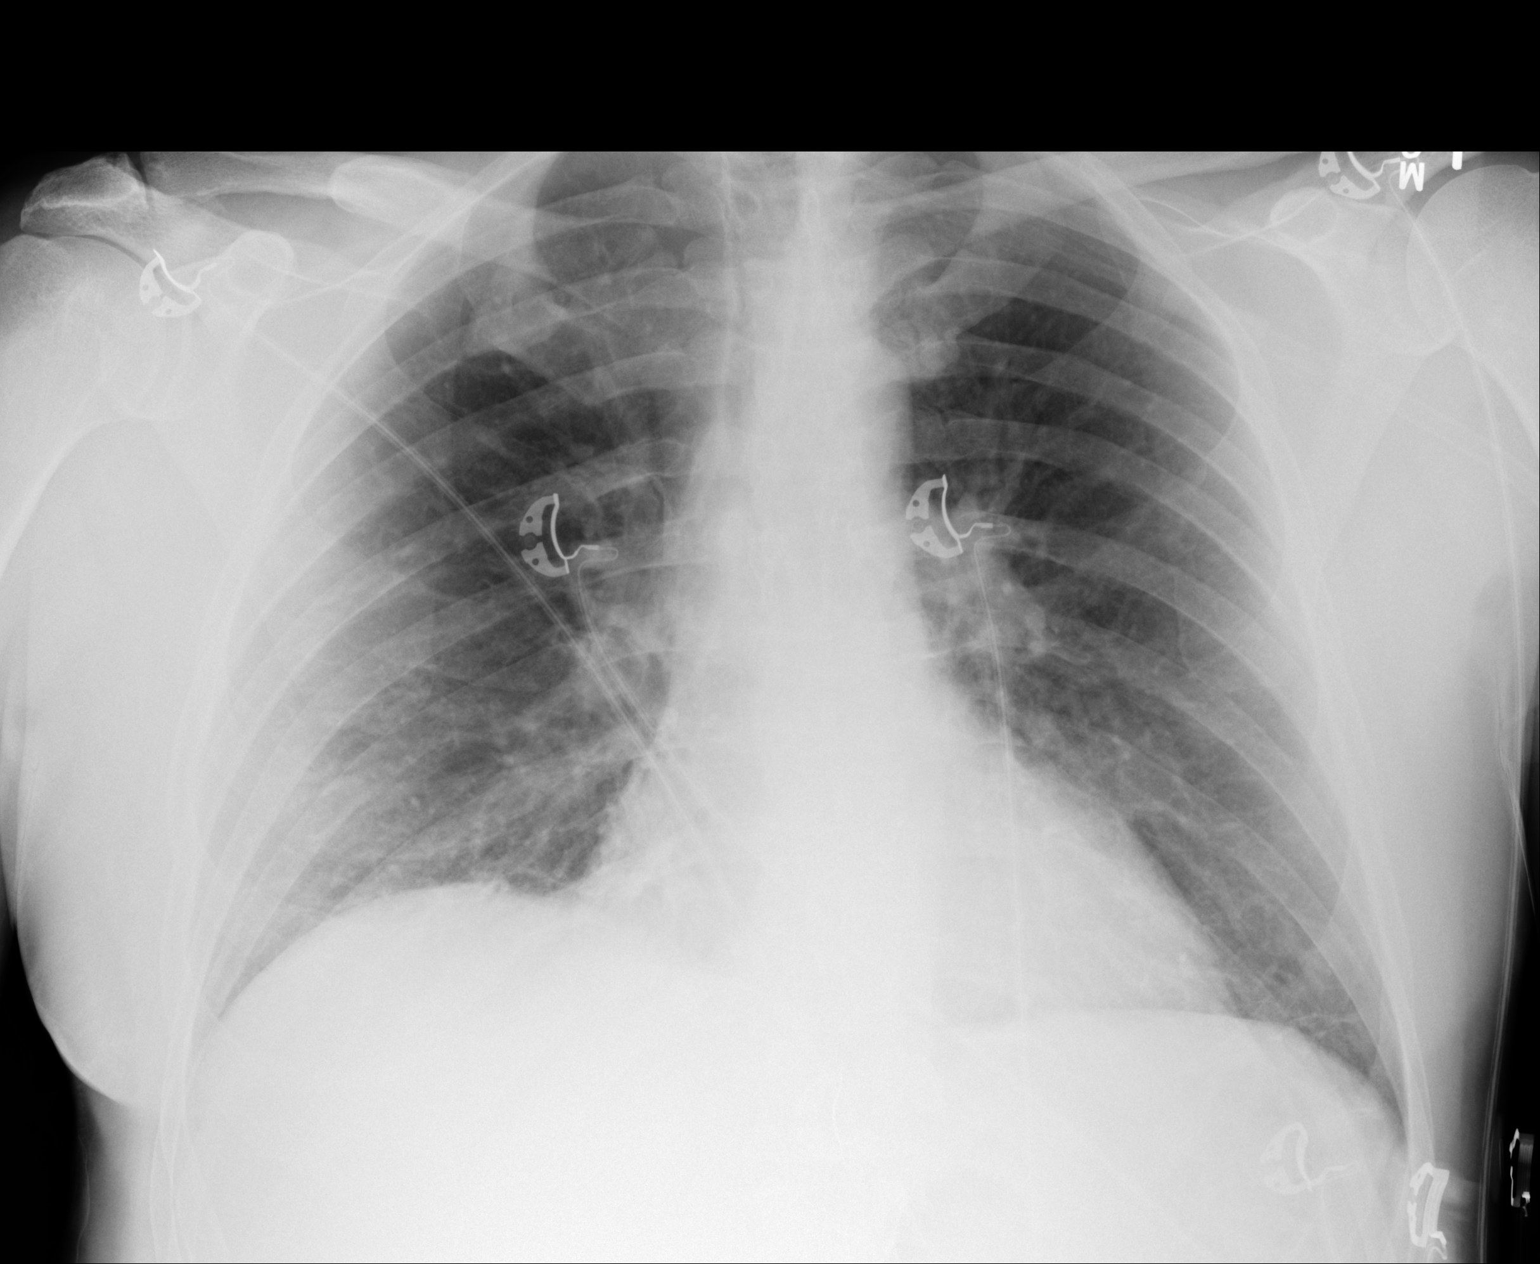

[1 of 1 positions shown; findings below may reference images not displayed]

FINDINGS: The cardiomediastinal silhouette is unremarkable.

Mild elevation of the right hemidiaphragm is again noted.

There is no evidence of focal airspace disease, pulmonary edema,
suspicious pulmonary nodule/mass, pleural effusion, or pneumothorax.
No acute bony abnormalities are identified.
IMPRESSION: No active disease.

## 2017-06-26 IMAGING — US US ABDOMEN COMPLETE
1 series · 14 of 25 positions shown · non-contrast
Comparison: None.

CLINICAL DATA: Left upper quadrant pain.

EXAM:
ULTRASOUND ABDOMEN COMPLETE

[Series 1: us abdomen complete · 0.18mm/px · 14 of 99 slices shown]
[im 1/99]
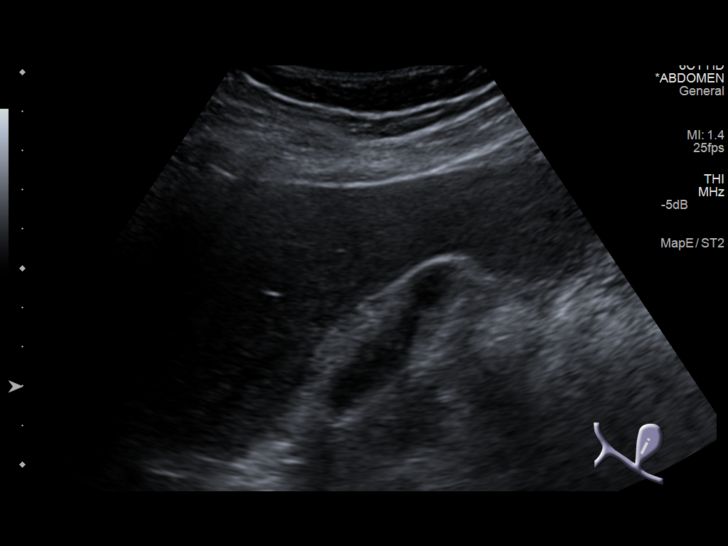
[im 9/99]
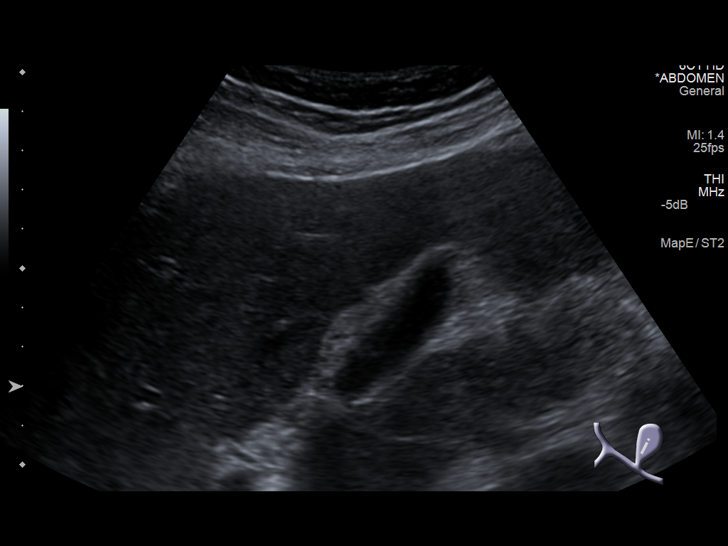
[im 17/99]
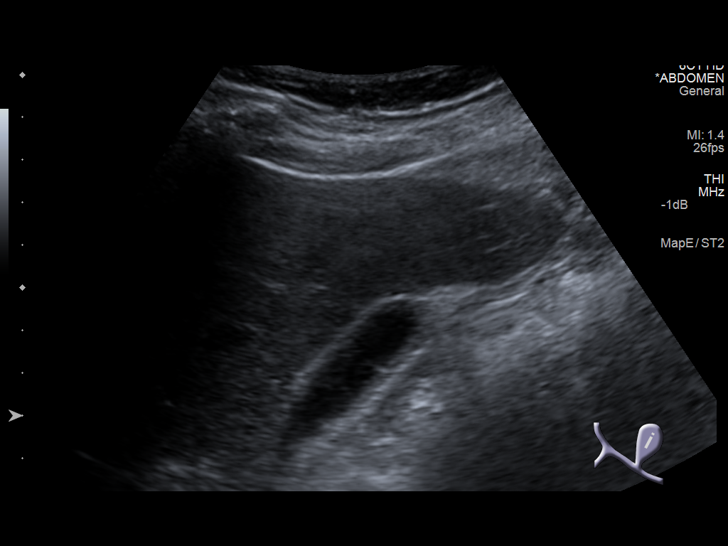
[im 25/99]
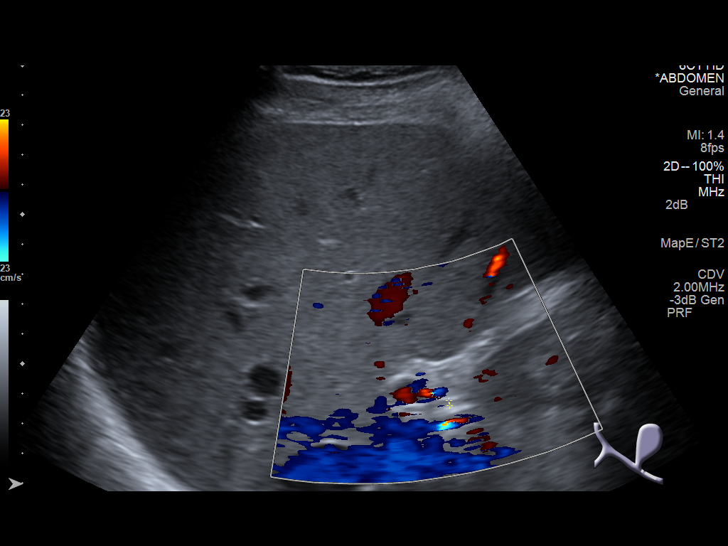
[im 33/99]
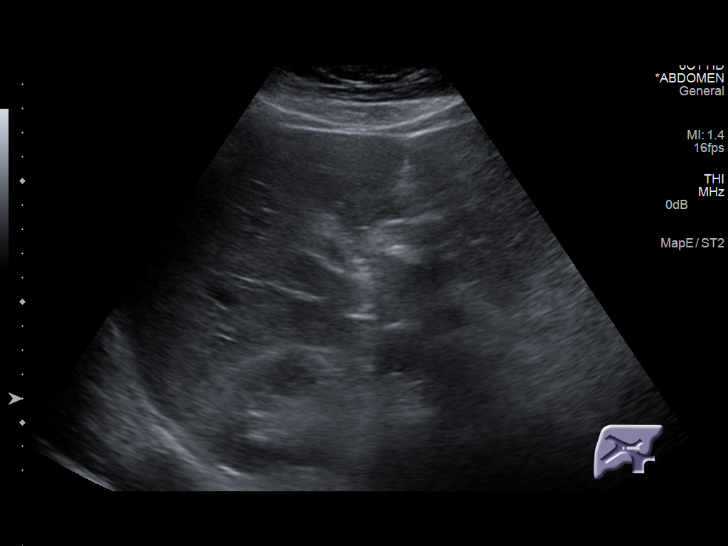
[im 37/99]
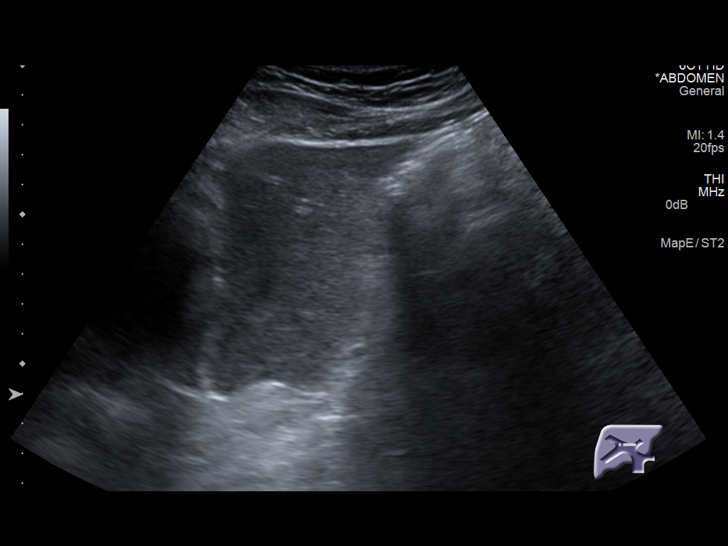
[im 45/99]
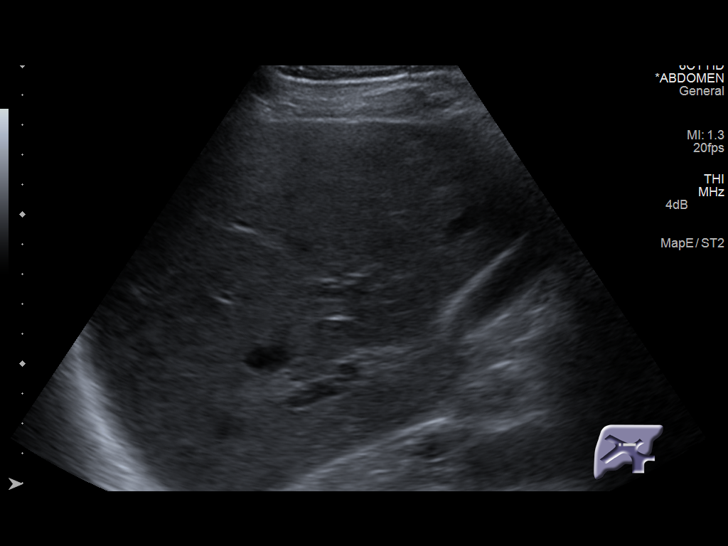
[im 54/99]
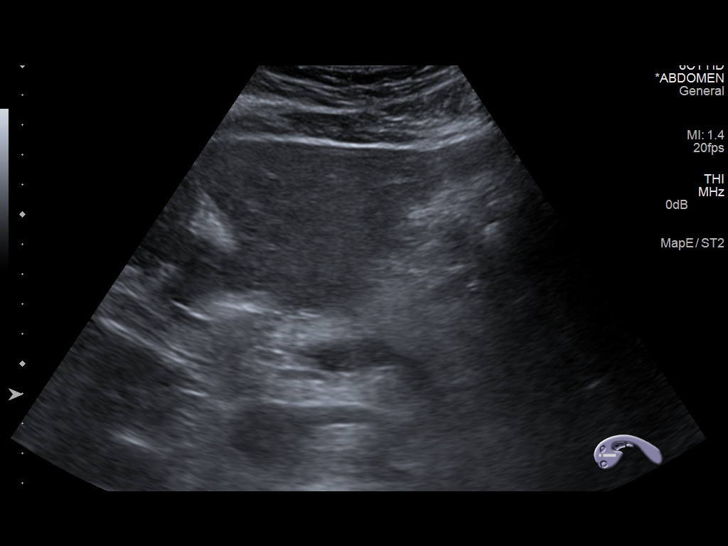
[im 62/99]
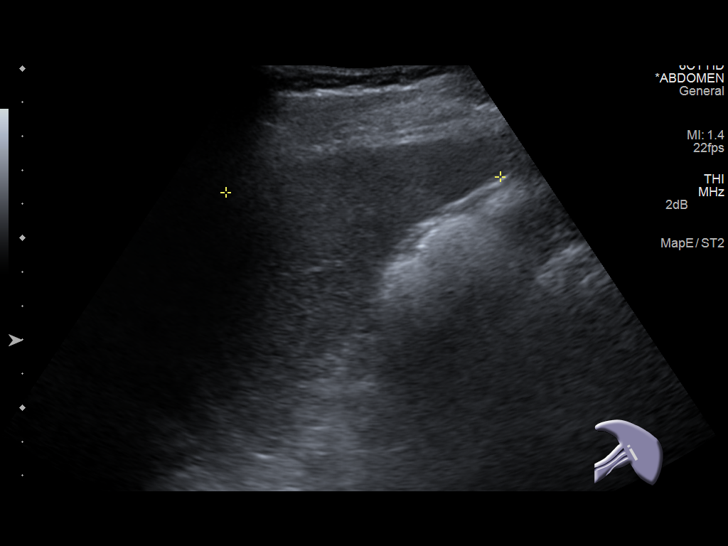
[im 66/99]
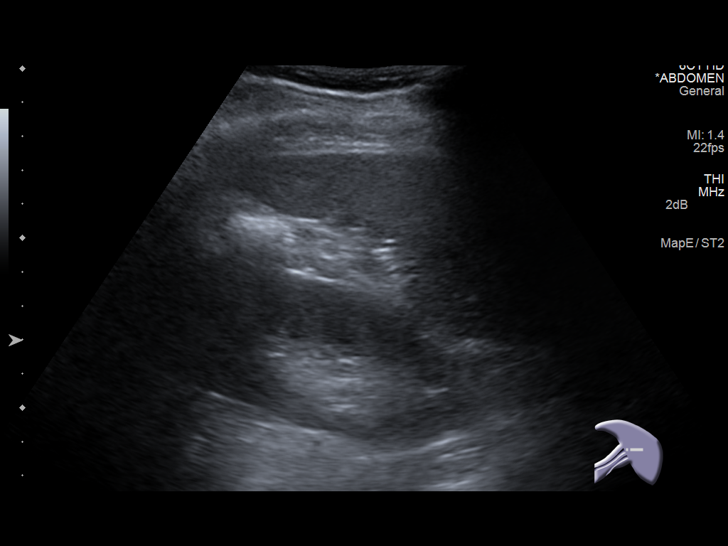
[im 74/99]
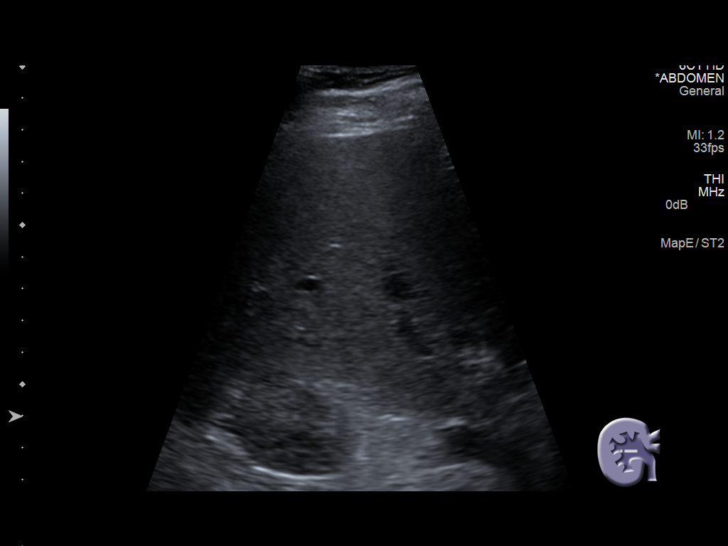
[im 82/99]
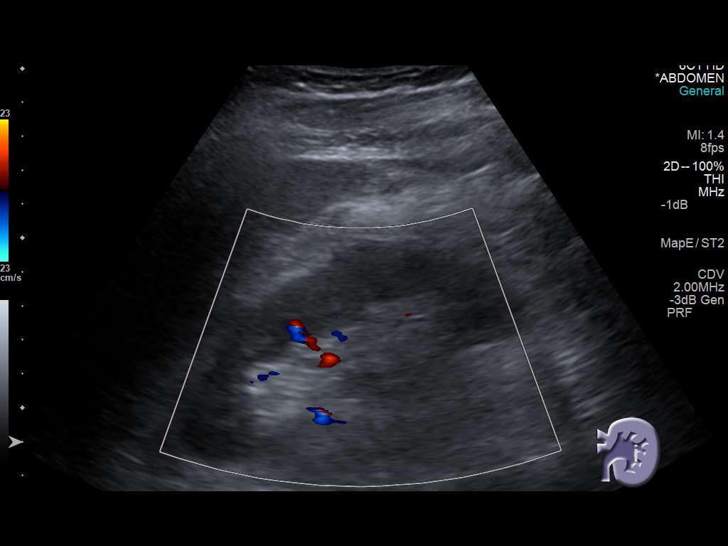
[im 90/99]
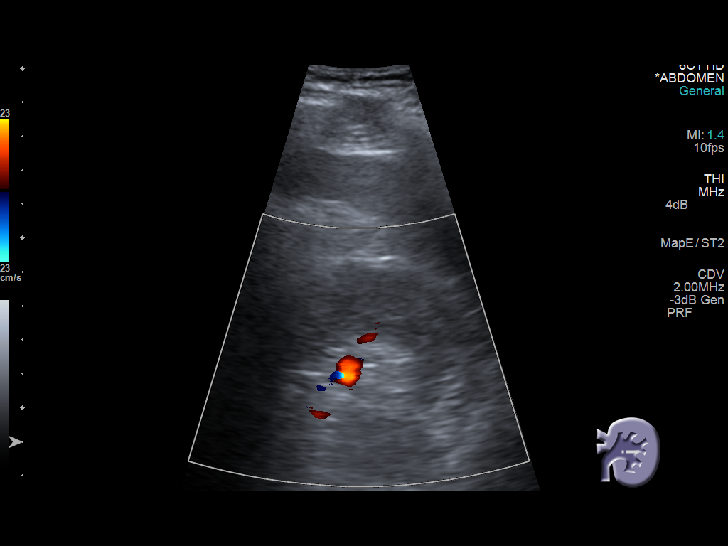
[im 99/99]
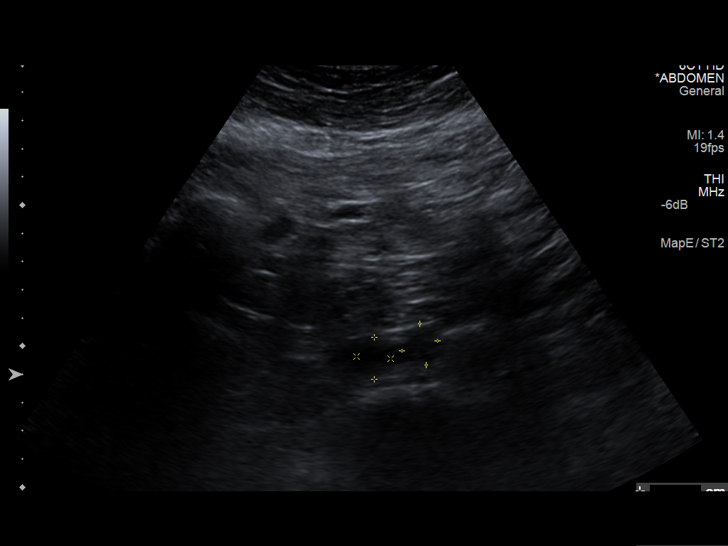

[14 of 25 positions shown; findings below may reference images not displayed]

FINDINGS: Gallbladder: The gallbladder wall is diffusely thickened, measuring
9 mm. No calculi are evident. The patient was not tender over the
gallbladder. There is no pericholecystic fluid.

Common bile duct: Diameter: 1.7 mm.

Liver: No focal lesion identified. Within normal limits in
parenchymal echogenicity.

IVC: No abnormality visualized.

Pancreas: Visualized portion unremarkable.

Spleen: Size and appearance within normal limits.

Right Kidney: Length: 10.3 cm. Echogenicity within normal limits. No
mass or hydronephrosis visualized.

Left Kidney: Length: 10.7 cm. Echogenicity within normal limits. No
mass or hydronephrosis visualized.

Abdominal aorta: No aneurysm visualized.

Other findings: None.
IMPRESSION: Normal spleen. Diffusely thickened gallbladder wall without
significant tenderness and without pericholecystic fluid. Consider
HIDA scan to evaluate gallbladder function. Cannot entirely exclude
a gallbladder mass.
